# Patient Record
Sex: Female | Born: 1935
Health system: Southern US, Community
[De-identification: ages and names within clinical notes are randomized; demographics above are authoritative.]

## PROBLEM LIST (undated history)

## (undated) DIAGNOSIS — T4145XA Adverse effect of unspecified anesthetic, initial encounter: Secondary | ICD-10-CM

## (undated) DIAGNOSIS — E785 Hyperlipidemia, unspecified: Secondary | ICD-10-CM

## (undated) DIAGNOSIS — M1990 Unspecified osteoarthritis, unspecified site: Secondary | ICD-10-CM

## (undated) DIAGNOSIS — Z9221 Personal history of antineoplastic chemotherapy: Secondary | ICD-10-CM

## (undated) DIAGNOSIS — R112 Nausea with vomiting, unspecified: Secondary | ICD-10-CM

## (undated) DIAGNOSIS — C801 Malignant (primary) neoplasm, unspecified: Secondary | ICD-10-CM

## (undated) DIAGNOSIS — C50919 Malignant neoplasm of unspecified site of unspecified female breast: Secondary | ICD-10-CM

## (undated) DIAGNOSIS — Z923 Personal history of irradiation: Secondary | ICD-10-CM

## (undated) DIAGNOSIS — T8859XA Other complications of anesthesia, initial encounter: Secondary | ICD-10-CM

## (undated) DIAGNOSIS — Z9889 Other specified postprocedural states: Secondary | ICD-10-CM

## (undated) DIAGNOSIS — I10 Essential (primary) hypertension: Secondary | ICD-10-CM

## (undated) HISTORY — DX: Malignant (primary) neoplasm, unspecified: C80.1

## (undated) HISTORY — DX: Hyperlipidemia, unspecified: E78.5

## (undated) HISTORY — DX: Essential (primary) hypertension: I10

## (undated) HISTORY — PX: ABDOMINAL HYSTERECTOMY: SHX81

---

## 1991-01-13 DIAGNOSIS — C50919 Malignant neoplasm of unspecified site of unspecified female breast: Secondary | ICD-10-CM

## 1991-01-13 DIAGNOSIS — Z923 Personal history of irradiation: Secondary | ICD-10-CM

## 1991-01-13 HISTORY — DX: Personal history of irradiation: Z92.3

## 1991-01-13 HISTORY — DX: Malignant neoplasm of unspecified site of unspecified female breast: C50.919

## 1991-01-13 HISTORY — PX: BREAST LUMPECTOMY: SHX2

## 1991-11-13 HISTORY — PX: BREAST SURGERY: SHX581

## 1997-11-27 ENCOUNTER — Ambulatory Visit (HOSPITAL_COMMUNITY): Admission: RE | Admit: 1997-11-27 | Discharge: 1997-11-27 | Payer: Self-pay | Admitting: Surgery

## 1998-11-29 ENCOUNTER — Encounter: Payer: Self-pay | Admitting: Surgery

## 1998-11-29 ENCOUNTER — Ambulatory Visit (HOSPITAL_COMMUNITY): Admission: RE | Admit: 1998-11-29 | Discharge: 1998-11-29 | Payer: Self-pay | Admitting: Surgery

## 1999-12-02 ENCOUNTER — Encounter: Payer: Self-pay | Admitting: Surgery

## 1999-12-02 ENCOUNTER — Ambulatory Visit (HOSPITAL_COMMUNITY): Admission: RE | Admit: 1999-12-02 | Discharge: 1999-12-02 | Payer: Self-pay | Admitting: Surgery

## 2000-12-14 ENCOUNTER — Ambulatory Visit (HOSPITAL_COMMUNITY): Admission: RE | Admit: 2000-12-14 | Discharge: 2000-12-14 | Payer: Self-pay | Admitting: Surgery

## 2000-12-14 ENCOUNTER — Encounter: Payer: Self-pay | Admitting: Surgery

## 2001-12-15 ENCOUNTER — Encounter: Payer: Self-pay | Admitting: Surgery

## 2001-12-15 ENCOUNTER — Ambulatory Visit (HOSPITAL_COMMUNITY): Admission: RE | Admit: 2001-12-15 | Discharge: 2001-12-15 | Payer: Self-pay | Admitting: Surgery

## 2002-12-18 ENCOUNTER — Ambulatory Visit (HOSPITAL_COMMUNITY): Admission: RE | Admit: 2002-12-18 | Discharge: 2002-12-18 | Payer: Self-pay | Admitting: Surgery

## 2003-12-25 ENCOUNTER — Ambulatory Visit (HOSPITAL_COMMUNITY): Admission: RE | Admit: 2003-12-25 | Discharge: 2003-12-25 | Payer: Self-pay | Admitting: Surgery

## 2004-12-30 ENCOUNTER — Ambulatory Visit (HOSPITAL_COMMUNITY): Admission: RE | Admit: 2004-12-30 | Discharge: 2004-12-30 | Payer: Self-pay | Admitting: Surgery

## 2005-12-30 ENCOUNTER — Ambulatory Visit (HOSPITAL_COMMUNITY): Admission: RE | Admit: 2005-12-30 | Discharge: 2005-12-30 | Payer: Self-pay | Admitting: Obstetrics and Gynecology

## 2007-01-03 ENCOUNTER — Ambulatory Visit (HOSPITAL_COMMUNITY): Admission: RE | Admit: 2007-01-03 | Discharge: 2007-01-03 | Payer: Self-pay | Admitting: Surgery

## 2008-01-04 ENCOUNTER — Ambulatory Visit (HOSPITAL_COMMUNITY): Admission: RE | Admit: 2008-01-04 | Discharge: 2008-01-04 | Payer: Self-pay | Admitting: Obstetrics and Gynecology

## 2008-06-15 ENCOUNTER — Ambulatory Visit (HOSPITAL_COMMUNITY): Admission: RE | Admit: 2008-06-15 | Discharge: 2008-06-15 | Payer: Self-pay | Admitting: Unknown Physician Specialty

## 2009-01-08 ENCOUNTER — Ambulatory Visit (HOSPITAL_COMMUNITY): Admission: RE | Admit: 2009-01-08 | Discharge: 2009-01-08 | Payer: Self-pay | Admitting: Surgery

## 2010-01-09 ENCOUNTER — Ambulatory Visit (HOSPITAL_COMMUNITY)
Admission: RE | Admit: 2010-01-09 | Discharge: 2010-01-09 | Payer: Self-pay | Source: Home / Self Care | Attending: Surgery | Admitting: Surgery

## 2010-06-26 ENCOUNTER — Other Ambulatory Visit: Payer: Self-pay | Admitting: Unknown Physician Specialty

## 2010-06-26 DIAGNOSIS — M8580 Other specified disorders of bone density and structure, unspecified site: Secondary | ICD-10-CM

## 2010-06-26 DIAGNOSIS — Z780 Asymptomatic menopausal state: Secondary | ICD-10-CM

## 2010-06-26 DIAGNOSIS — Z1231 Encounter for screening mammogram for malignant neoplasm of breast: Secondary | ICD-10-CM

## 2010-12-03 ENCOUNTER — Other Ambulatory Visit (HOSPITAL_COMMUNITY): Payer: Self-pay | Admitting: Unknown Physician Specialty

## 2010-12-03 DIAGNOSIS — Z1231 Encounter for screening mammogram for malignant neoplasm of breast: Secondary | ICD-10-CM

## 2011-01-15 ENCOUNTER — Ambulatory Visit (HOSPITAL_COMMUNITY)
Admission: RE | Admit: 2011-01-15 | Discharge: 2011-01-15 | Disposition: A | Discharge status: Home / Self Care | Payer: BC Managed Care – PPO | Source: Ambulatory Visit | Attending: Unknown Physician Specialty | Admitting: Unknown Physician Specialty

## 2011-01-15 DIAGNOSIS — Z780 Asymptomatic menopausal state: Secondary | ICD-10-CM

## 2011-01-15 DIAGNOSIS — Z1231 Encounter for screening mammogram for malignant neoplasm of breast: Secondary | ICD-10-CM

## 2011-04-06 ENCOUNTER — Encounter (INDEPENDENT_AMBULATORY_CARE_PROVIDER_SITE_OTHER): Payer: Self-pay

## 2011-04-06 DIAGNOSIS — Z853 Personal history of malignant neoplasm of breast: Secondary | ICD-10-CM | POA: Insufficient documentation

## 2011-04-08 ENCOUNTER — Encounter (INDEPENDENT_AMBULATORY_CARE_PROVIDER_SITE_OTHER): Payer: Self-pay | Admitting: Surgery

## 2011-04-08 ENCOUNTER — Ambulatory Visit (INDEPENDENT_AMBULATORY_CARE_PROVIDER_SITE_OTHER): Payer: Medicare Other | Admitting: Surgery

## 2011-04-08 VITALS — BP 156/98 | HR 72 | Temp 98.1°F | Resp 20 | Ht 61.5 in | Wt 181.2 lb

## 2011-04-08 DIAGNOSIS — Z853 Personal history of malignant neoplasm of breast: Secondary | ICD-10-CM
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
## 2011-09-29 DIAGNOSIS — C549 Malignant neoplasm of corpus uteri, unspecified: Secondary | ICD-10-CM | POA: Insufficient documentation

## 2011-12-15 ENCOUNTER — Other Ambulatory Visit (HOSPITAL_COMMUNITY): Payer: Self-pay | Admitting: Unknown Physician Specialty

## 2011-12-15 DIAGNOSIS — Z853 Personal history of malignant neoplasm of breast: Secondary | ICD-10-CM

## 2011-12-15 DIAGNOSIS — Z1231 Encounter for screening mammogram for malignant neoplasm of breast: Secondary | ICD-10-CM

## 2012-01-18 ENCOUNTER — Ambulatory Visit (HOSPITAL_COMMUNITY)
Admission: RE | Admit: 2012-01-18 | Discharge: 2012-01-18 | Disposition: A | Discharge status: Home / Self Care | Payer: Medicare Other | Source: Ambulatory Visit | Attending: Unknown Physician Specialty | Admitting: Unknown Physician Specialty

## 2012-01-18 DIAGNOSIS — Z853 Personal history of malignant neoplasm of breast: Secondary | ICD-10-CM | POA: Insufficient documentation

## 2012-01-18 DIAGNOSIS — Z1231 Encounter for screening mammogram for malignant neoplasm of breast: Secondary | ICD-10-CM

## 2012-12-19 ENCOUNTER — Other Ambulatory Visit (HOSPITAL_COMMUNITY): Payer: Self-pay | Admitting: Unknown Physician Specialty

## 2012-12-19 DIAGNOSIS — Z1231 Encounter for screening mammogram for malignant neoplasm of breast: Secondary | ICD-10-CM

## 2013-01-20 ENCOUNTER — Ambulatory Visit (HOSPITAL_COMMUNITY)
Admission: RE | Admit: 2013-01-20 | Discharge: 2013-01-20 | Disposition: A | Discharge status: Home / Self Care | Payer: Medicare Other | Source: Ambulatory Visit | Attending: Unknown Physician Specialty | Admitting: Unknown Physician Specialty

## 2013-01-20 DIAGNOSIS — Z1231 Encounter for screening mammogram for malignant neoplasm of breast: Secondary | ICD-10-CM | POA: Insufficient documentation

## 2013-04-25 DIAGNOSIS — E1142 Type 2 diabetes mellitus with diabetic polyneuropathy: Secondary | ICD-10-CM | POA: Insufficient documentation

## 2013-12-19 ENCOUNTER — Other Ambulatory Visit (HOSPITAL_COMMUNITY): Payer: Self-pay | Admitting: Unknown Physician Specialty

## 2013-12-19 DIAGNOSIS — Z1231 Encounter for screening mammogram for malignant neoplasm of breast: Secondary | ICD-10-CM

## 2014-01-22 ENCOUNTER — Ambulatory Visit (HOSPITAL_COMMUNITY)
Admission: RE | Admit: 2014-01-22 | Discharge: 2014-01-22 | Disposition: A | Discharge status: Home / Self Care | Payer: Medicare Other | Source: Ambulatory Visit | Attending: Unknown Physician Specialty | Admitting: Unknown Physician Specialty

## 2014-01-22 DIAGNOSIS — Z1231 Encounter for screening mammogram for malignant neoplasm of breast: Secondary | ICD-10-CM | POA: Insufficient documentation

## 2014-09-14 DIAGNOSIS — H18519 Endothelial corneal dystrophy, unspecified eye: Secondary | ICD-10-CM | POA: Insufficient documentation

## 2014-12-28 ENCOUNTER — Other Ambulatory Visit: Payer: Self-pay | Admitting: Unknown Physician Specialty

## 2014-12-28 DIAGNOSIS — Z1231 Encounter for screening mammogram for malignant neoplasm of breast: Secondary | ICD-10-CM

## 2015-01-25 ENCOUNTER — Ambulatory Visit (INDEPENDENT_AMBULATORY_CARE_PROVIDER_SITE_OTHER): Payer: PPO

## 2015-01-25 DIAGNOSIS — Z1231 Encounter for screening mammogram for malignant neoplasm of breast: Secondary | ICD-10-CM | POA: Diagnosis not present

## 2015-02-15 DIAGNOSIS — H401131 Primary open-angle glaucoma, bilateral, mild stage: Secondary | ICD-10-CM | POA: Insufficient documentation

## 2015-04-18 ENCOUNTER — Other Ambulatory Visit: Payer: Self-pay | Admitting: Unknown Physician Specialty

## 2015-04-18 DIAGNOSIS — M8580 Other specified disorders of bone density and structure, unspecified site: Secondary | ICD-10-CM

## 2015-04-19 ENCOUNTER — Other Ambulatory Visit: Payer: Self-pay | Admitting: Unknown Physician Specialty

## 2015-04-19 DIAGNOSIS — M8580 Other specified disorders of bone density and structure, unspecified site: Secondary | ICD-10-CM

## 2015-05-01 ENCOUNTER — Ambulatory Visit (INDEPENDENT_AMBULATORY_CARE_PROVIDER_SITE_OTHER): Payer: PPO

## 2015-05-01 DIAGNOSIS — M85859 Other specified disorders of bone density and structure, unspecified thigh: Secondary | ICD-10-CM | POA: Diagnosis not present

## 2015-05-01 DIAGNOSIS — M8580 Other specified disorders of bone density and structure, unspecified site: Secondary | ICD-10-CM

## 2015-05-06 ENCOUNTER — Other Ambulatory Visit: Payer: PPO

## 2015-08-11 DIAGNOSIS — H1812 Bullous keratopathy, left eye: Secondary | ICD-10-CM | POA: Insufficient documentation

## 2015-12-19 ENCOUNTER — Other Ambulatory Visit: Payer: Self-pay | Admitting: Unknown Physician Specialty

## 2015-12-19 DIAGNOSIS — Z1231 Encounter for screening mammogram for malignant neoplasm of breast: Secondary | ICD-10-CM

## 2016-01-13 HISTORY — PX: BREAST LUMPECTOMY: SHX2

## 2016-01-28 ENCOUNTER — Ambulatory Visit (INDEPENDENT_AMBULATORY_CARE_PROVIDER_SITE_OTHER): Payer: Medicare HMO

## 2016-01-28 DIAGNOSIS — R69 Illness, unspecified: Secondary | ICD-10-CM | POA: Diagnosis not present

## 2016-01-28 DIAGNOSIS — Z1231 Encounter for screening mammogram for malignant neoplasm of breast: Secondary | ICD-10-CM

## 2016-01-28 DIAGNOSIS — R928 Other abnormal and inconclusive findings on diagnostic imaging of breast: Secondary | ICD-10-CM | POA: Diagnosis not present

## 2016-01-31 ENCOUNTER — Other Ambulatory Visit: Payer: Self-pay | Admitting: Unknown Physician Specialty

## 2016-01-31 DIAGNOSIS — R928 Other abnormal and inconclusive findings on diagnostic imaging of breast: Secondary | ICD-10-CM

## 2016-02-07 ENCOUNTER — Ambulatory Visit
Admission: RE | Admit: 2016-02-07 | Discharge: 2016-02-07 | Disposition: A | Discharge status: Home / Self Care | Payer: Medicare HMO | Source: Ambulatory Visit | Attending: Unknown Physician Specialty | Admitting: Unknown Physician Specialty

## 2016-02-07 ENCOUNTER — Other Ambulatory Visit: Payer: Self-pay | Admitting: Unknown Physician Specialty

## 2016-02-07 DIAGNOSIS — N6310 Unspecified lump in the right breast, unspecified quadrant: Secondary | ICD-10-CM

## 2016-02-07 DIAGNOSIS — R928 Other abnormal and inconclusive findings on diagnostic imaging of breast: Secondary | ICD-10-CM

## 2016-02-07 DIAGNOSIS — N6312 Unspecified lump in the right breast, upper inner quadrant: Secondary | ICD-10-CM | POA: Diagnosis not present

## 2016-02-13 ENCOUNTER — Other Ambulatory Visit: Payer: Self-pay | Admitting: Unknown Physician Specialty

## 2016-02-13 DIAGNOSIS — N6310 Unspecified lump in the right breast, unspecified quadrant: Secondary | ICD-10-CM

## 2016-02-14 ENCOUNTER — Ambulatory Visit
Admission: RE | Admit: 2016-02-14 | Discharge: 2016-02-14 | Disposition: A | Discharge status: Home / Self Care | Payer: Medicare HMO | Source: Ambulatory Visit | Attending: Unknown Physician Specialty | Admitting: Unknown Physician Specialty

## 2016-02-14 DIAGNOSIS — D0511 Intraductal carcinoma in situ of right breast: Secondary | ICD-10-CM | POA: Diagnosis not present

## 2016-02-14 DIAGNOSIS — N6314 Unspecified lump in the right breast, lower inner quadrant: Secondary | ICD-10-CM | POA: Diagnosis not present

## 2016-02-14 DIAGNOSIS — N6312 Unspecified lump in the right breast, upper inner quadrant: Secondary | ICD-10-CM | POA: Diagnosis not present

## 2016-02-14 DIAGNOSIS — N6310 Unspecified lump in the right breast, unspecified quadrant: Secondary | ICD-10-CM

## 2016-02-18 ENCOUNTER — Other Ambulatory Visit: Payer: Self-pay | Admitting: Surgery

## 2016-02-18 DIAGNOSIS — D0591 Unspecified type of carcinoma in situ of right breast: Secondary | ICD-10-CM | POA: Diagnosis not present

## 2016-02-18 DIAGNOSIS — Z853 Personal history of malignant neoplasm of breast: Secondary | ICD-10-CM | POA: Diagnosis not present

## 2016-02-27 ENCOUNTER — Other Ambulatory Visit: Payer: Self-pay | Admitting: Surgery

## 2016-02-27 DIAGNOSIS — D0591 Unspecified type of carcinoma in situ of right breast: Secondary | ICD-10-CM

## 2016-03-09 ENCOUNTER — Encounter (HOSPITAL_COMMUNITY): Payer: Self-pay

## 2016-03-09 ENCOUNTER — Other Ambulatory Visit: Payer: Self-pay

## 2016-03-09 ENCOUNTER — Encounter (HOSPITAL_COMMUNITY)
Admission: RE | Admit: 2016-03-09 | Discharge: 2016-03-09 | Disposition: A | Discharge status: Home / Self Care | Payer: Medicare HMO | Source: Ambulatory Visit | Attending: Surgery | Admitting: Surgery

## 2016-03-09 DIAGNOSIS — Z8542 Personal history of malignant neoplasm of other parts of uterus: Secondary | ICD-10-CM | POA: Insufficient documentation

## 2016-03-09 DIAGNOSIS — E785 Hyperlipidemia, unspecified: Secondary | ICD-10-CM | POA: Diagnosis not present

## 2016-03-09 DIAGNOSIS — I10 Essential (primary) hypertension: Secondary | ICD-10-CM | POA: Insufficient documentation

## 2016-03-09 DIAGNOSIS — Z853 Personal history of malignant neoplasm of breast: Secondary | ICD-10-CM | POA: Insufficient documentation

## 2016-03-09 DIAGNOSIS — E119 Type 2 diabetes mellitus without complications: Secondary | ICD-10-CM | POA: Insufficient documentation

## 2016-03-09 DIAGNOSIS — Z01812 Encounter for preprocedural laboratory examination: Secondary | ICD-10-CM | POA: Insufficient documentation

## 2016-03-09 HISTORY — DX: Other specified postprocedural states: Z98.890

## 2016-03-09 HISTORY — DX: Other specified postprocedural states: R11.2

## 2016-03-09 HISTORY — DX: Adverse effect of unspecified anesthetic, initial encounter: T41.45XA

## 2016-03-09 HISTORY — DX: Other complications of anesthesia, initial encounter: T88.59XA

## 2016-03-09 HISTORY — DX: Unspecified osteoarthritis, unspecified site: M19.90

## 2016-03-09 LAB — GLUCOSE, CAPILLARY: Glucose-Capillary: 199 mg/dL — ABNORMAL HIGH (ref 65–99)

## 2016-03-09 LAB — BASIC METABOLIC PANEL
Anion gap: 10 (ref 5–15)
BUN: 7 mg/dL (ref 6–20)
CO2: 26 mmol/L (ref 22–32)
Calcium: 9.7 mg/dL (ref 8.9–10.3)
Chloride: 101 mmol/L (ref 101–111)
Creatinine, Ser: 0.62 mg/dL (ref 0.44–1.00)
GFR calc Af Amer: >60 mL/min (ref >60)
GFR calc non Af Amer: >60 mL/min (ref >60)
Glucose, Bld: 199 mg/dL — ABNORMAL HIGH (ref 65–99)
Potassium: 3.4 mmol/L — ABNORMAL LOW (ref 3.5–5.1)
Sodium: 137 mmol/L (ref 135–145)

## 2016-03-09 LAB — CBC
HCT: 41.8 % (ref 36.0–46.0)
Hemoglobin: 13.7 g/dL (ref 12.0–15.0)
MCH: 28.7 pg (ref 26.0–34.0)
MCHC: 32.8 g/dL (ref 30.0–36.0)
MCV: 87.6 fL (ref 78.0–100.0)
Platelets: 339 10*3/uL (ref 150–400)
RBC: 4.77 MIL/uL (ref 3.87–5.11)
RDW: 14.4 % (ref 11.5–15.5)
WBC: 10.2 10*3/uL (ref 4.0–10.5)

## 2016-03-09 MED ORDER — CHLORHEXIDINE GLUCONATE CLOTH 2 % EX PADS: 6.0000 | MEDICATED_PAD | Freq: Once | CUTANEOUS | Status: DC

## 2016-03-09 NOTE — Progress Notes (Signed)
PCP: Finis Bud, MD  Cardiologist: pt denies  EKG: pt denies  ECHO: denies  Cardiac Cath: pt denies  Stress test: pt denies  Chest X-ray: pt denies past year

## 2016-03-09 NOTE — Pre-Procedure Instructions (Signed)
Navi Polis Jaworski  03/09/2016      [facility] - [place], [place] - [address] [place] [address] Phone: [phone] Fax: [place] [address] [place] [address] Phone: [phone] Fax: [phone]    Your procedure is scheduled on Fri, Mar 2 @ 7:30 AM  Report to [facility] at 5:30 AM  Call this number if you have problems the morning of surgery:  [phone]   Remember:  Do not eat food or drink liquids after midnight.  Take these medicines the morning of surgery with A SIP OF WATER Eye Drops             Stop taking your Vitamins along with any Herbal Medications. No Goody's,BC's,Aleve,Advil,Motrin,Ibuprofen,or Fish Oil.      How to Manage Your Diabetes Before and After Surgery  Why is it important to control my blood sugar before and after surgery? . Improving blood sugar levels before and after surgery helps healing and can limit problems. . A way of improving blood sugar control is eating a healthy diet by: o  Eating less sugar and carbohydrates o  Increasing activity/exercise o  Talking with your doctor about reaching your blood sugar goals . High blood sugars (greater than 180 mg/dL) can raise your risk of infections and slow your recovery, so you will need to focus on controlling your diabetes during the weeks before surgery. . Make sure that the doctor who takes care of your diabetes knows about your planned surgery including the date and location.  How do I manage my blood sugar before surgery? . Check your blood sugar at least 4 times a day, starting 2 days before surgery, to make sure that the level is not too high or low. o Check your blood sugar the morning of your surgery when you wake up and every 2 hours until you get to the Short Stay unit. . If your blood sugar is less than 70 mg/dL, you will need to treat for low blood  sugar: o Do not take insulin. o Treat a low blood sugar (less than 70 mg/dL) with  cup of clear juice (cranberry or apple), 4 glucose tablets, OR glucose gel. o Recheck blood sugar in 15 minutes after treatment (to make sure it is greater than 70 mg/dL). If your blood sugar is not greater than 70 mg/dL on recheck, call [phone] for further instructions. . Report your blood sugar to the short stay nurse when you get to Short Stay.  . If you are admitted to the hospital after surgery: o Your blood sugar will be checked by the staff and you will probably be given insulin after surgery (instead of oral diabetes medicines) to make sure you have good blood sugar levels. o The goal for blood sugar control after surgery is 80-180 mg/dL.              WHAT DO I DO ABOUT MY DIABETES MEDICATION?   Marland Kitchen Do not take oral diabetes medicines (pills) the morning of surgery.        Reviewed and Endorsed by [facility] Patient Education Committee, August 2015   Do not wear jewelry, make-up or nail polish.  Do not wear lotions, powders, perfumes, or deoderant.  Do not shave 48 hours prior to surgery.    Do not bring valuables to the hospital.  [facility] is  not responsible for any belongings or valuables.  Contacts, dentures or bridgework may not be worn into surgery.  Leave your suitcase in the car.  After surgery it may be brought to your room.  For patients admitted to the hospital, discharge time will be determined by your treatment team.  Patients discharged the day of surgery will not be allowed to drive home.   Special instructions:   [facility] - Preparing for Surgery  Before surgery, you can play an important role.  Because skin is not sterile, your skin needs to be as free of germs as possible.  You can reduce the number of germs on you skin by washing with CHG (chlorahexidine gluconate) soap before surgery.  CHG is an antiseptic cleaner which kills germs and bonds with the  skin to continue killing germs even after washing.  Please DO NOT use if you have an allergy to CHG or antibacterial soaps.  If your skin becomes reddened/irritated stop using the CHG and inform your nurse when you arrive at Short Stay.  Do not shave (including legs and underarms) for at least 48 hours prior to the first CHG shower.  You may shave your face.  Please follow these instructions carefully:   1.  Shower with CHG Soap the night before surgery and the                                morning of Surgery.  2.  If you choose to wash your hair, wash your hair first as usual with your       normal shampoo.  3.  After you shampoo, rinse your hair and body thoroughly to remove the                      Shampoo.  4.  Use CHG as you would any other liquid soap.  You can apply chg directly       to the skin and wash gently with scrungie or a clean washcloth.  5.  Apply the CHG Soap to your body ONLY FROM THE NECK DOWN.        Do not use on open wounds or open sores.  Avoid contact with your eyes,       ears, mouth and genitals (private parts).  Wash genitals (private parts)       with your normal soap.  6.  Wash thoroughly, paying special attention to the area where your surgery        will be performed.  7.  Thoroughly rinse your body with warm water from the neck down.  8.  DO NOT shower/wash with your normal soap after using and rinsing off       the CHG Soap.  9.  Pat yourself dry with a clean towel.            10.  Wear clean pajamas.            11.  Place clean sheets on your bed the night of your first shower and do not        sleep with pets.  Day of Surgery  Do not apply any lotions/deoderants the morning of surgery.  Please wear clean clothes to the hospital/surgery center.  Please read over the following fact sheets that you were given. Pain Booklet, Coughing and Deep Breathing and Surgical Site Infection Prevention

## 2016-03-10 LAB — HEMOGLOBIN A1C
Hgb A1c MFr Bld: 6.8 % — ABNORMAL HIGH (ref 4.8–5.6)
Mean Plasma Glucose: 148 mg/dL

## 2016-03-11 ENCOUNTER — Ambulatory Visit
Admission: RE | Admit: 2016-03-11 | Discharge: 2016-03-11 | Disposition: A | Discharge status: Home / Self Care | Payer: Medicare HMO | Source: Ambulatory Visit | Attending: Surgery | Admitting: Surgery

## 2016-03-11 ENCOUNTER — Inpatient Hospital Stay: Admission: RE | Admit: 2016-03-11 | Payer: Medicare HMO | Source: Ambulatory Visit

## 2016-03-11 ENCOUNTER — Other Ambulatory Visit: Payer: Self-pay | Admitting: Surgery

## 2016-03-11 DIAGNOSIS — D0591 Unspecified type of carcinoma in situ of right breast: Secondary | ICD-10-CM

## 2016-03-11 DIAGNOSIS — C50911 Malignant neoplasm of unspecified site of right female breast: Secondary | ICD-10-CM | POA: Diagnosis not present

## 2016-03-12 ENCOUNTER — Encounter (HOSPITAL_COMMUNITY): Payer: Self-pay | Admitting: Anesthesiology

## 2016-03-12 NOTE — H&P (Signed)
[place]  Location: [facility] Patient #: [id number] DOB: Nov 24, 1934 Divorced / Language: English / Race: Black or African American Female  History of Present Illness   The patient is a 82 year old female who presents with a complaint of breast cancer.  The PCP is Dr. Quincy Sheehan  The patient was referred by Dr. Franki Cabot.   Letta Pate, youngest daughter, is with her.  I last saw Annelisse in 04/08/2011 for follow-up of a stage I right breast cancer she had in 04/05/1991. She had a 1.2 cm grade 1, invasive ductal carcinoma with 0/9 nodes treated with right breast lumpectomy and radiation therapy. Dr. Nila Nephew saw her for radiation therapy. I placed her on tamoxifen for 5 years after the surgery.  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 This was treated in [place] with a TAHBSO, rad tx, and chemo tx. She is disease free as far as she knows.  She had a screening mammogram on 01/28/2016 which showed an asyemtry in the right breast at the 3 o'clock position. Her breast density is "b". The is a 0.6 x 0.4 cm mass at the 3 o'clock position. She had a right breast biopsy at 3 o'clock on 02/14/2016. Her path [id number]) showed right DCIS, low grade. ER - 100% and PR - 95%.  I discussed the options for breast cancer treatment with the patient. I discussed a multidisciplinary approach to the treatment of breast cancer, which includes medical oncology and radiation oncology. I discussed the surgical options of lumpectomy vs. mastectomy. If mastectomy, there is the possibility of reconstruction. I discussed the options of lymph node biopsy. The treatment plan depends on the pathologic staging of the tumor and the patient's personal wishes. The risks of surgery include, but are not limited to, bleeding, infection, the need for further surgery, and nerve injury. The patient has been given literature on the treatment of  breast cancer.  Plan: 1) Inspite of being the second cancer in her right breast, I think that this tumor is so small and the pathology is so favorable, we could just do a lumpectomy, 2) Consider antiestrogen, depending on final pathology  Past Medical History: 1. Right breast cancer, Stage 1. Lumpectomy 1993. 0/9 nodes positive. For the last surgery - she had N&V and had to spend the night.  2. left eye corneal tranplant x 3 Doing well now 3. Diabetes Sees dr. Susette Racer, Endocrinology in [place] On insulin HgbA1C - 10/07/2015 - 6.8 4. She had uterine cancer in Sept 2013 From notes that I can see, she had a mixed Mullerian uterine sarcoma She had a TAHBSO - then had rad tx x 4, and chemo tx x 4 She talked about losing her hair. She is seen by Marlane Mingle, PA in [place] - the last encounter I can find was 01/02/2016 5. Obese BMI - 34.6  Social History: Divorced (divorced husband is dead) She retired in 2001-04-04. Letta Pate, youngest daughter, is with her. She has 7 children total - 5 sons and 2 daughters.    Past Surgical History Malachy Moan, [place]; 02/18/2016 4:36 PM) Breast Biopsy  Right. Breast Mass; Local Excision  Right. Cataract Surgery  Left. Hysterectomy (due to cancer) - Complete   Diagnostic Studies History Malachy Moan, [place]; 02/18/2016 4:36 PM) Colonoscopy  1-5 years ago Mammogram  1-3 years ago  Allergies Malachy Moan, RMA; 02/18/2016 4:37 PM) No Known Allergies 02/18/2016  Medication History Holley Dexter  Quentin Ore, RMA; 02/18/2016 4:38 PM) Brimonidine Tartrate (0.2% Solution, Ophthalmic) Active. GlipiZIDE XL (10MG Tablet ER 24HR, Oral) Active. Losartan Potassium-HCTZ (100-12.5MG Tablet, Oral) Active. OneTouch Ultra Blue (In Vitro) Active. Timolol Maleate (0.5% Solution, Ophthalmic) Active. Simvastatin (20MG Tablet, Oral)  Active. GlipiZIDE (10MG Tablet, Oral) Active. Betagan (0.5% Solution, Ophthalmic) Active. Hyzaar (100-12.5MG Tablet, Oral) Active. Multiple Vitamins (Oral) Active. Medications Reconciled  Social History Malachy Moan, [place]; 02/18/2016 4:36 PM) Caffeine use  Coffee, Tea. No alcohol use  No drug use  Tobacco use  Never smoker.  Family History Malachy Moan, [place]; 02/18/2016 4:36 PM) Alcohol Abuse  Brother. Cerebrovascular Accident  Brother, Mother. Diabetes Mellitus  Daughter, Sister. Heart Disease  Brother. Kidney Disease  Mother. Respiratory Condition  Father.  Pregnancy / Birth History Malachy Moan, [place]; 02/18/2016 4:36 PM) Age at menarche  74 years. Age of menopause  <45 Gravida  8 Irregular periods  Maternal age  65-20 Para  50  Other Problems Malachy Moan, [place]; 02/18/2016 4:36 PM) Bladder Problems  Breast Cancer  Diabetes Mellitus  General anesthesia - complications  High blood pressure  Hypercholesterolemia  Lump In Breast     Review of Systems Malachy Moan RMA; 02/18/2016 4:36 PM) General Not Present- Appetite Loss, Chills, Fatigue, Fever, Night Sweats, Weight Gain and Weight Loss. Skin Not Present- Change in Wart/Mole, Dryness, Hives, Jaundice, New Lesions, Non-Healing Wounds, Rash and Ulcer. HEENT Present- Wears glasses/contact lenses. Not Present- Earache, Hearing Loss, Hoarseness, Nose Bleed, Oral Ulcers, Ringing in the Ears, Seasonal Allergies, Sinus Pain, Sore Throat, Visual Disturbances and Yellow Eyes. Respiratory Present- Snoring. Not Present- Bloody sputum, Chronic Cough, Difficulty Breathing and Wheezing. Breast Present- Breast Mass. Not Present- Breast Pain, Nipple Discharge and Skin Changes. Cardiovascular Not Present- Chest Pain, Difficulty Breathing Lying Down, Leg Cramps, Palpitations, Rapid Heart Rate, Shortness of Breath and Swelling of Extremities. Gastrointestinal Not Present- Abdominal Pain, Bloating,  Bloody Stool, Change in Bowel Habits, Chronic diarrhea, Constipation, Difficulty Swallowing, Excessive gas, Gets full quickly at meals, Hemorrhoids, Indigestion, Nausea, Rectal Pain and Vomiting.  Vitals Malachy Moan RMA; 02/18/2016 4:38 PM) 02/18/2016 4:38 PM Weight: 189.6 lb Height: 62in Body Surface Area: 1.87 m Body Mass Index: 34.68 kg/m  Temp.: 98.48F  Pulse: 77 (Regular)  BP: 140/90 (Sitting, Left Arm, Standard)   Physical Exam  General: older obese AA F alert and generally healthy appearing. HEENT: Normal. Pupils equal. She pointed out one area of dark hair on her left side that was left after chemotx.  Neck: Supple. No mass. No thyroid mass. Lymph Nodes: No supraclavicular or cervical nodes.  Lungs: Clear to auscultation and symmetric breath sounds. Heart: RRR. No murmur or rub.  Breasts: Right - No obvious mass. Her right breast smaller than the left. The scars looks okay. I don't feel a mass. Left - no mass or nodule.  Abdomen: Soft. No mass. No tenderness. No hernia. Normal bowel sounds. Obese.  Extremities: Good strength and ROM in upper and lower extremities.  Neurologic: Grossly intact to motor and sensory function. Psychiatric: Has normal mood and affect. Behavior is normal.   Assessment & Plan  1.  BREAST CANCER, STAGE 0, RIGHT (D05.91)  Story: Right breast biopsy on 02/14/2016. Her path [id number]) showed right DCIS. ER/PR positive.  Plan   1) right breast lumpectomy (seed localization)   2) possible antiestrogen therapy post op  2.  HISTORY OF RIGHT BREAST CANCER (Z85.3)  Story: In 1993. She had a 1.2 cm grade 1, invasive ductal carcinoma with 0/9 nodes treated with right  breast lumpectomy and radiation therapy.  Rad tx by Dr. Sherren Kerns.  3. left eye corneal tranplant x 3  Doing well now 4. Diabetes  Sees dr. Susette Racer, Endocrinology in [place]  On insulin  HgbA1C - 10/07/2015 - 6.8 5. She had uterine  cancer in Sept 2013   From notes that I can see, she had a mixed Mullerian uterine sarcoma  She had a TAHBSO - then had rad tx x 4, and chemo tx x 4  She talked about losing her hair.  She is seen by Marlane Mingle, PA in [place] - the last encounter I can find was 01/02/2016 6. Obese BMI - 34.6   Alphonsa Overall, MD, [facility] Pager: [phone] Office phone:  [phone]

## 2016-03-12 NOTE — Anesthesia Preprocedure Evaluation (Addendum)
Anesthesia Evaluation  Patient identified by MRN, date of birth, ID band Patient awake    Reviewed: Allergy & Precautions, NPO status , Patient's Chart, lab work & pertinent test results  History of Anesthesia Complications (+) PONV and history of anesthetic complications  Airway Mallampati: I  TM Distance: >3 FB Neck ROM: full    Dental  (+) Teeth Intact, Dental Advidsory Given   Pulmonary neg pulmonary ROS,    breath sounds clear to auscultation       Cardiovascular hypertension, Pt. on medications  Rhythm:regular Rate:Normal     Neuro/Psych negative neurological ROS  negative psych ROS   GI/Hepatic negative GI ROS, Neg liver ROS,   Endo/Other  diabetes, Type 2, Insulin Dependent, Oral Hypoglycemic Agents  Renal/GU negative Renal ROS  negative genitourinary   Musculoskeletal  (+) Arthritis , Osteoarthritis,    Abdominal   Peds negative pediatric ROS (+)  Hematology negative hematology ROS (+)   Anesthesia Other Findings Obese   Reproductive/Obstetrics negative OB ROS                           Anesthesia Physical Anesthesia Plan  ASA: II  Anesthesia Plan: General   Post-op Pain Management:    Induction: Intravenous  Airway Management Planned: LMA  Additional Equipment:   Intra-op Plan:   Post-operative Plan: Extubation in OR  Informed Consent: I have reviewed the patients History and Physical, chart, labs and discussed the procedure including the risks, benefits and alternatives for the proposed anesthesia with the patient or authorized representative who has indicated his/her understanding and acceptance.   Dental advisory given and Dental Advisory Given  Plan Discussed with: CRNA, Surgeon and Anesthesiologist  Anesthesia Plan Comments:        Anesthesia Quick Evaluation

## 2016-03-13 ENCOUNTER — Ambulatory Visit (HOSPITAL_COMMUNITY): Payer: Medicare HMO | Admitting: Anesthesiology

## 2016-03-13 ENCOUNTER — Ambulatory Visit
Admission: RE | Admit: 2016-03-13 | Discharge: 2016-03-13 | Disposition: A | Discharge status: Home / Self Care | Payer: Medicare HMO | Source: Ambulatory Visit | Attending: Surgery | Admitting: Surgery

## 2016-03-13 ENCOUNTER — Encounter (HOSPITAL_COMMUNITY): Payer: Self-pay | Admitting: *Deleted

## 2016-03-13 ENCOUNTER — Ambulatory Visit (HOSPITAL_COMMUNITY)
Admission: RE | Admit: 2016-03-13 | Discharge: 2016-03-13 | Disposition: A | Discharge status: Home / Self Care | Payer: Medicare HMO | Source: Ambulatory Visit | Attending: Surgery | Admitting: Surgery

## 2016-03-13 ENCOUNTER — Encounter (HOSPITAL_COMMUNITY)
Admission: RE | Disposition: A | Discharge status: Home / Self Care | Payer: Self-pay | Source: Ambulatory Visit | Attending: Surgery

## 2016-03-13 DIAGNOSIS — Z853 Personal history of malignant neoplasm of breast: Secondary | ICD-10-CM | POA: Diagnosis not present

## 2016-03-13 DIAGNOSIS — R928 Other abnormal and inconclusive findings on diagnostic imaging of breast: Secondary | ICD-10-CM | POA: Diagnosis not present

## 2016-03-13 DIAGNOSIS — Z794 Long term (current) use of insulin: Secondary | ICD-10-CM | POA: Diagnosis not present

## 2016-03-13 DIAGNOSIS — Z6836 Body mass index (BMI) 36.0-36.9, adult: Secondary | ICD-10-CM | POA: Diagnosis not present

## 2016-03-13 DIAGNOSIS — E119 Type 2 diabetes mellitus without complications: Secondary | ICD-10-CM | POA: Insufficient documentation

## 2016-03-13 DIAGNOSIS — Z79899 Other long term (current) drug therapy: Secondary | ICD-10-CM | POA: Insufficient documentation

## 2016-03-13 DIAGNOSIS — I10 Essential (primary) hypertension: Secondary | ICD-10-CM | POA: Insufficient documentation

## 2016-03-13 DIAGNOSIS — E669 Obesity, unspecified: Secondary | ICD-10-CM | POA: Diagnosis not present

## 2016-03-13 DIAGNOSIS — C50911 Malignant neoplasm of unspecified site of right female breast: Secondary | ICD-10-CM | POA: Diagnosis not present

## 2016-03-13 DIAGNOSIS — Z8542 Personal history of malignant neoplasm of other parts of uterus: Secondary | ICD-10-CM | POA: Insufficient documentation

## 2016-03-13 DIAGNOSIS — Z923 Personal history of irradiation: Secondary | ICD-10-CM | POA: Insufficient documentation

## 2016-03-13 DIAGNOSIS — D0591 Unspecified type of carcinoma in situ of right breast: Secondary | ICD-10-CM

## 2016-03-13 DIAGNOSIS — E7800 Pure hypercholesterolemia, unspecified: Secondary | ICD-10-CM | POA: Insufficient documentation

## 2016-03-13 DIAGNOSIS — Z7982 Long term (current) use of aspirin: Secondary | ICD-10-CM | POA: Diagnosis not present

## 2016-03-13 DIAGNOSIS — D0511 Intraductal carcinoma in situ of right breast: Secondary | ICD-10-CM | POA: Diagnosis not present

## 2016-03-13 DIAGNOSIS — N6011 Diffuse cystic mastopathy of right breast: Secondary | ICD-10-CM | POA: Diagnosis not present

## 2016-03-13 HISTORY — PX: BREAST LUMPECTOMY WITH RADIOACTIVE SEED LOCALIZATION: SHX6424

## 2016-03-13 LAB — GLUCOSE, CAPILLARY
Glucose-Capillary: 160 mg/dL — ABNORMAL HIGH (ref 65–99)
Glucose-Capillary: 158 mg/dL — ABNORMAL HIGH (ref 65–99)

## 2016-03-13 SURGERY — BREAST LUMPECTOMY WITH RADIOACTIVE SEED LOCALIZATION
Anesthesia: General | Site: Breast | Laterality: Right

## 2016-03-13 MED ORDER — DEXAMETHASONE SODIUM PHOSPHATE 10 MG/ML IJ SOLN
INTRAMUSCULAR | Status: DC | PRN
  Administered 2016-03-13: 10 mg via INTRAVENOUS

## 2016-03-13 MED ORDER — GABAPENTIN 300 MG PO CAPS
ORAL_CAPSULE | ORAL | Status: AC
  Administered 2016-03-13: 300 mg via ORAL
  Filled 2016-03-13: qty 1

## 2016-03-13 MED ORDER — SCOPOLAMINE 1 MG/3DAYS TD PT72SCOPOLAMINE 1 MG/3DAYS
MEDICATED_PATCH | TRANSDERMAL | Status: DC | PRN
Start: 2016-03-13 — End: 2016-03-13
  Administered 2016-03-13: 1 via TRANSDERMAL

## 2016-03-13 MED ORDER — FENTANYL CITRATE (PF) 100 MCG/2ML IJ SOLN
INTRAMUSCULAR | Status: AC
  Filled 2016-03-13: qty 4

## 2016-03-13 MED ORDER — 0.9 % SODIUM CHLORIDE (POUR BTL) OPTIME
TOPICAL | Status: DC | PRN
Start: 2016-03-13 — End: 2016-03-13
  Administered 2016-03-13: 1000 mL

## 2016-03-13 MED ORDER — FENTANYL CITRATE (PF) 100 MCG/2ML IJ SOLN
INTRAMUSCULAR | Status: DC | PRN
  Administered 2016-03-13 (×2): 25 ug via INTRAVENOUS
  Administered 2016-03-13: 50 ug via INTRAVENOUS

## 2016-03-13 MED ORDER — LACTATED RINGERS IV SOLN
INTRAVENOUS | Status: DC | PRN
  Administered 2016-03-13 (×2): via INTRAVENOUS

## 2016-03-13 MED ORDER — PROPOFOL 10 MG/ML IV BOLUS
INTRAVENOUS | Status: AC
  Filled 2016-03-13: qty 20

## 2016-03-13 MED ORDER — ONDANSETRON HCL 4 MG/2ML IJ SOLN
INTRAMUSCULAR | Status: DC | PRN
  Administered 2016-03-13: 4 mg via INTRAVENOUS

## 2016-03-13 MED ORDER — BUPIVACAINE-EPINEPHRINE (PF) 0.5% -1:200000 IJ SOLN
INTRAMUSCULAR | Status: DC | PRN
  Administered 2016-03-13: 30 mL

## 2016-03-13 MED ORDER — SCOPOLAMINE 1 MG/3DAYS TD PT72
MEDICATED_PATCH | TRANSDERMAL | Status: AC
  Filled 2016-03-13: qty 1

## 2016-03-13 MED ORDER — PROPOFOL 10 MG/ML IV BOLUS
INTRAVENOUS | Status: DC | PRN
  Administered 2016-03-13: 150 mg via INTRAVENOUS

## 2016-03-13 MED ORDER — PHENYLEPHRINE HCL 10 MG/ML IJ SOLN
INTRAMUSCULAR | Status: DC | PRN
  Administered 2016-03-13: 80 ug via INTRAVENOUS

## 2016-03-13 MED ORDER — GABAPENTIN 300 MG PO CAPS
300.0000 mg | ORAL_CAPSULE | ORAL | Status: AC
  Administered 2016-03-13: 300 mg via ORAL

## 2016-03-13 MED ORDER — PROMETHAZINE HCL 25 MG/ML IJ SOLN
6.2500 mg | INTRAMUSCULAR | Status: DC | PRN
Start: 2016-03-13 — End: 2016-03-13

## 2016-03-13 MED ORDER — FENTANYL CITRATE (PF) 100 MCG/2ML IJ SOLN: 25.0000 ug | INTRAMUSCULAR | Status: DC | PRN

## 2016-03-13 MED ORDER — BUPIVACAINE HCL (PF) 0.25 % IJ SOLN
INTRAMUSCULAR | Status: AC
  Filled 2016-03-13: qty 30

## 2016-03-13 MED ORDER — LIDOCAINE HCL (CARDIAC) 20 MG/ML IV SOLN
INTRAVENOUS | Status: DC | PRN
  Administered 2016-03-13: 60 mg via INTRAVENOUS

## 2016-03-13 MED ORDER — BUPIVACAINE-EPINEPHRINE (PF) 0.5% -1:200000 IJ SOLN
INTRAMUSCULAR | Status: AC
  Filled 2016-03-13: qty 30

## 2016-03-13 MED ORDER — ACETAMINOPHEN 500 MG PO TABS
ORAL_TABLET | ORAL | Status: AC
  Administered 2016-03-13: 1000 mg via ORAL
  Filled 2016-03-13: qty 2

## 2016-03-13 MED ORDER — ACETAMINOPHEN 500 MG PO TABS
1000.0000 mg | ORAL_TABLET | ORAL | Status: AC
  Administered 2016-03-13: 1000 mg via ORAL

## 2016-03-13 MED ORDER — CEFAZOLIN SODIUM-DEXTROSE 2-4 GM/100ML-% IV SOLN
2.0000 g | INTRAVENOUS | Status: AC
  Administered 2016-03-13: 2 g via INTRAVENOUS
  Filled 2016-03-13: qty 100

## 2016-03-13 MED ORDER — MEPERIDINE HCL 25 MG/ML IJ SOLN: 6.2500 mg | INTRAMUSCULAR | Status: DC | PRN

## 2016-03-13 MED ORDER — LACTATED RINGERS IV SOLN: INTRAVENOUS | Status: DC

## 2016-03-13 MED ORDER — TRAMADOL HCL 50 MG PO TABS: 50.0000 mg | ORAL_TABLET | Freq: Four times a day (QID) | ORAL | 0 refills | Status: DC | PRN

## 2016-03-13 SURGICAL SUPPLY — 46 items
ADH SKN CLS APL DERMABOND .7 (GAUZE/BANDAGES/DRESSINGS) ×1
APPLIER CLIP 9.375 MED OPEN (MISCELLANEOUS)
APR CLP MED 9.3 20 MLT OPN (MISCELLANEOUS)
BINDER BREAST LRG (GAUZE/BANDAGES/DRESSINGS) IMPLANT
BINDER BREAST XLRG (GAUZE/BANDAGES/DRESSINGS) ×1 IMPLANT
BLADE SURG 15 STRL LF DISP TIS (BLADE) ×1 IMPLANT
BLADE SURG 15 STRL SS (BLADE) ×2
CANISTER SUCT 3000ML PPV (MISCELLANEOUS) ×2 IMPLANT
CHLORAPREP W/TINT 26ML (MISCELLANEOUS) ×2 IMPLANT
CLIP APPLIE 9.375 MED OPEN (MISCELLANEOUS) IMPLANT
CLIP TI WIDE RED SMALL 6 (CLIP) ×2 IMPLANT
COVER PROBE W GEL 5X96 (DRAPES) ×2 IMPLANT
COVER SURGICAL LIGHT HANDLE (MISCELLANEOUS) ×2 IMPLANT
DERMABOND ADVANCED (GAUZE/BANDAGES/DRESSINGS) ×1
DERMABOND ADVANCED .7 DNX12 (GAUZE/BANDAGES/DRESSINGS) ×1 IMPLANT
DEVICE DUBIN SPECIMEN MAMMOGRA (MISCELLANEOUS) ×2 IMPLANT
DRAPE CHEST BREAST 15X10 FENES (DRAPES) ×2 IMPLANT
DRAPE UTILITY XL STRL (DRAPES) ×2 IMPLANT
ELECT COATED BLADE 2.86 ST (ELECTRODE) ×2 IMPLANT
ELECT REM PT RETURN 9FT ADLT (ELECTROSURGICAL) ×2
ELECTRODE REM PT RTRN 9FT ADLT (ELECTROSURGICAL) ×1 IMPLANT
GAUZE SPONGE 4X4 12PLY STRL (GAUZE/BANDAGES/DRESSINGS) ×2 IMPLANT
GLOVE BIOGEL PI IND STRL 7.0 (GLOVE) IMPLANT
GLOVE BIOGEL PI INDICATOR 7.0 (GLOVE) ×1
GLOVE SURG SIGNA 7.5 PF LTX (GLOVE) ×2 IMPLANT
GLOVE SURG SS PI 7.0 STRL IVOR (GLOVE) ×1 IMPLANT
GOWN STRL REUS W/ TWL LRG LVL3 (GOWN DISPOSABLE) ×1 IMPLANT
GOWN STRL REUS W/ TWL XL LVL3 (GOWN DISPOSABLE) ×1 IMPLANT
GOWN STRL REUS W/TWL LRG LVL3 (GOWN DISPOSABLE) ×2
GOWN STRL REUS W/TWL XL LVL3 (GOWN DISPOSABLE) ×2
KIT BASIN OR (CUSTOM PROCEDURE TRAY) ×2 IMPLANT
KIT MARKER MARGIN INK (KITS) ×2 IMPLANT
NDL HYPO 25GX1X1/2 BEV (NEEDLE) ×1 IMPLANT
NEEDLE HYPO 25GX1X1/2 BEV (NEEDLE) ×2 IMPLANT
NS IRRIG 1000ML POUR BTL (IV SOLUTION) ×1 IMPLANT
PACK SURGICAL SETUP 50X90 (CUSTOM PROCEDURE TRAY) ×2 IMPLANT
PENCIL BUTTON HOLSTER BLD 10FT (ELECTRODE) ×2 IMPLANT
SPONGE LAP 18X18 X RAY DECT (DISPOSABLE) ×2 IMPLANT
SUT MNCRL AB 4-0 PS2 18 (SUTURE) ×2 IMPLANT
SUT VIC AB 3-0 SH 8-18 (SUTURE) ×2 IMPLANT
SYR BULB 3OZ (MISCELLANEOUS) ×2 IMPLANT
SYR CONTROL 10ML LL (SYRINGE) ×2 IMPLANT
TOWEL OR 17X24 6PK STRL BLUE (TOWEL DISPOSABLE) ×2 IMPLANT
TOWEL OR 17X26 10 PK STRL BLUE (TOWEL DISPOSABLE) ×2 IMPLANT
TUBE CONNECTING 12X1/4 (SUCTIONS) ×2 IMPLANT
YANKAUER SUCT BULB TIP NO VENT (SUCTIONS) ×2 IMPLANT

## 2016-03-13 NOTE — Discharge Instructions (Signed)
[facility] - DISCHARGE INSTRUCTIONS TO PATIENT  Activity:  Driving - May drive in 2 or 3 days   Lifting - No lifting more than 15 pounds for 5 days, then no limit  Wound Care:   Leave bandage on for 2 days.  Then remove the bandage and may shower.  Diet:  As tolerated  Follow up appointment:  Call Dr. Pollie Friar office [facility]) at [phone] for an appointment in 2 - 3 weeks.  Medications and dosages:  Resume your home medications.  You have a prescription for:  Ultram  Call Dr. Lucia Gaskins or his office  [phone]) if you have:  Temperature greater than 100.4,  Persistent nausea and vomiting,  Severe uncontrolled pain,  Redness, tenderness, or signs of infection (pain, swelling, redness, odor or green/yellow discharge around the site),  Difficulty breathing, headache or visual disturbances,  Any other questions or concerns you may have after discharge.  In an emergency, call 911 or go to an Emergency Department at a nearby hospital.

## 2016-03-13 NOTE — Interval H&P Note (Signed)
History and Physical Interval Note:  03/13/2016 7:11 AM  Alexandra Francis  has presented today for surgery, with the diagnosis of RIGHT BREAST CANCER  The various methods of treatment have been discussed with the patient and family.  Daughter, Sharyn Lull, at bedside.  After consideration of risks, benefits and other options for treatment, the patient has consented to  Procedure(s): RIGHT BREAST LUMPECTOMY WITH RADIOACTIVE SEED LOCALIZATION (Right) as a surgical intervention .  The patient's history has been reviewed, patient examined, no change in status, stable for surgery.  I have reviewed the patient's chart and labs.  Questions were answered to the patient's satisfaction.     , H

## 2016-03-13 NOTE — Op Note (Signed)
03/13/2016  8:44 AM  PATIENT:  Alexandra Francis DOB: 14-Jan-1934 MRN: [id number]  PREOP DIAGNOSIS:   RIGHT BREAST CANCER  POSTOP DIAGNOSIS:    Right breast cancer, Ductal Carcinoma in Situ, 3 o'clock position (Tis, N0)  PROCEDURE:   Procedure(s): RIGHT BREAST LUMPECTOMY WITH RADIOACTIVE SEED LOCALIZATION,   SURGEON:   Alphonsa Overall, M.D.  ANESTHESIA:   general  Anesthesiologist: Effie Berkshire, MD CRNA: Valda Favia, CRNA; Neldon Newport, CRNA  General  EBL:  minimal  ml  DRAINS:  none   LOCAL MEDICATIONS USED:   30 cc 1/2% marcaine  SPECIMEN:   Right breast lumpectomy (painted)  COUNTS CORRECT:  YES  INDICATIONS FOR PROCEDURE:  Alexandra Francis is a 82 y.o. (DOB: Aug 14, 1934) AA female whose primary care physician is Alexandra Bud, MD and comes for right breast lumpectomy .   The options for breast cancer treatment have been discussed with the patient. She elected to proceed with right breast lumpectomy.  She has had prior right breast cancer - and now she has a 0.6 cm mass in the right breast at 3 o'clock which on biopsy is DCIS.    The indications and potential complications of surgery were explained to the patient. Potential complications include, but are not limited to, bleeding, infection, the need for further surgery, and nerve injury.     She had a I131 seed placed on 03/11/2016 in her right breast at [facility].  I confirmed the presence of the I131 seed in the pre op area using the Neoprobe.  The seed is in the 3 o'clock position of the right breast.  OPERATIVE NOTE:   The patient was taken to room # 2 at [facility] where she underwent a general anesthesia  supervised by Anesthesiologist: Effie Berkshire, MD CRNA: Valda Favia, CRNA; Neldon Newport, CRNA. Her right breast was prepped with  ChloraPrep and sterilely draped.    A time-out and the surgical check list was reviewed.    I turned attention to the cancer which was about at the 3 o'clock position of  the right breast.   I used the Neoprobe to identify the I131 seed.  I tried to excise an area around the tumor of at least 1 cm.    I excised this block of breast tissue approximately 3 cm by 4 cm  in diameter.  I took the excision down to the chest wall.   I painted the lumpectomy specimen with the 6 color paint kit and did a specimen mammogram which confirmed the mass, clip, and the seed were all in the right position in the specimen.  The specimen was sent to pathology who called back to confirm that they have the seed and the specimen.   I then irrigated the wound with saline. I infiltrated approximately 30 mL of 1/2% Marcaine between the incisions.   I then closed all the wounds in layers using 3-0 Vicryl sutures for the deep layer. At the skin, I closed the incisions with a 4-0 Monocryl suture. The incisions were then painted with Dermabond.  She had gauze place over the wounds and placed in a breast binder.   The patient tolerated the procedure well, was transported to the recovery room in good condition. Sponge and needle count were correct at the end of the case.   Final pathology is pending.   Alphonsa Overall, MD, [facility] Pager: [phone] Office phone:  [phone]

## 2016-03-13 NOTE — Anesthesia Postprocedure Evaluation (Addendum)
Anesthesia Post Note  Patient: Alexandra Francis  Procedure(s) Performed: Procedure(s) (LRB): RIGHT BREAST LUMPECTOMY WITH RADIOACTIVE SEED LOCALIZATION (Right)  Patient location during evaluation: PACU Anesthesia Type: General Level of consciousness: awake and alert Pain management: pain level controlled Vital Signs Assessment: post-procedure vital signs reviewed and stable Respiratory status: spontaneous breathing, nonlabored ventilation, respiratory function stable and patient connected to nasal cannula oxygen Cardiovascular status: blood pressure returned to baseline and stable Postop Assessment: no signs of nausea or vomiting Anesthetic complications: no       Last Vitals:  Vitals:   03/13/16 0945 03/13/16 0953  BP: (!) 164/66 (!) 174/61  Pulse: 74 74  Resp: 16 16  Temp: 36.3 C     Last Pain:  Vitals:   03/13/16 0953  TempSrc:   PainSc: 0-No pain                 Effie Berkshire

## 2016-03-13 NOTE — Transfer of Care (Signed)
Immediate Anesthesia Transfer of Care Note  Patient: Alexandra Francis  Procedure(s) Performed: Procedure(s): RIGHT BREAST LUMPECTOMY WITH RADIOACTIVE SEED LOCALIZATION (Right)  Patient Location: PACU  Anesthesia Type:General  Level of Consciousness: awake, alert  and oriented  Airway & Oxygen Therapy: Patient Spontanous Breathing and Patient connected to nasal cannula oxygen  Post-op Assessment: Report given to RN, Post -op Vital signs reviewed and stable and Patient moving all extremities X 4  Post vital signs: Reviewed and stable  Last Vitals:  Vitals:   03/13/16 0638  BP: (!) 187/65  Pulse: 72  Resp: 18  Temp: 36.7 C    Last Pain:  Vitals:   03/13/16 0638  TempSrc: Oral      Patients Stated Pain Goal: 8 ([phone] [id number])  Complications: No apparent anesthesia complications

## 2016-03-13 NOTE — Anesthesia Procedure Notes (Signed)
Procedure Name: LMA Insertion Date/Time: 03/13/2016 7:43 AM Performed by: Neldon Newport Pre-anesthesia Checklist: Timeout performed, Patient being monitored, Suction available, Emergency Drugs available and Patient identified Patient Re-evaluated:Patient Re-evaluated prior to inductionOxygen Delivery Method: Circle system utilized Preoxygenation: Pre-oxygenation with 100% oxygen Intubation Type: IV induction Ventilation: Mask ventilation without difficulty LMA: LMA inserted LMA Size: 4.0 Tube type: Oral Number of attempts: 2 Placement Confirmation: breath sounds checked- equal and bilateral and positive ETCO2 Dental Injury: Teeth and Oropharynx as per pre-operative assessment

## 2016-03-14 ENCOUNTER — Encounter (HOSPITAL_COMMUNITY): Payer: Self-pay | Admitting: Surgery

## 2016-03-27 DIAGNOSIS — H4089 Other specified glaucoma: Secondary | ICD-10-CM | POA: Diagnosis not present

## 2016-03-27 DIAGNOSIS — H1851 Endothelial corneal dystrophy: Secondary | ICD-10-CM | POA: Diagnosis not present

## 2016-03-27 DIAGNOSIS — Z947 Corneal transplant status: Secondary | ICD-10-CM | POA: Diagnosis not present

## 2016-04-06 DIAGNOSIS — E1165 Type 2 diabetes mellitus with hyperglycemia: Secondary | ICD-10-CM | POA: Diagnosis not present

## 2016-04-06 DIAGNOSIS — E1140 Type 2 diabetes mellitus with diabetic neuropathy, unspecified: Secondary | ICD-10-CM | POA: Diagnosis not present

## 2016-04-16 DIAGNOSIS — R69 Illness, unspecified: Secondary | ICD-10-CM | POA: Diagnosis not present

## 2016-05-01 DIAGNOSIS — I10 Essential (primary) hypertension: Secondary | ICD-10-CM | POA: Diagnosis not present

## 2016-05-01 DIAGNOSIS — E1140 Type 2 diabetes mellitus with diabetic neuropathy, unspecified: Secondary | ICD-10-CM | POA: Diagnosis not present

## 2016-05-01 DIAGNOSIS — E7800 Pure hypercholesterolemia, unspecified: Secondary | ICD-10-CM | POA: Diagnosis not present

## 2016-05-01 DIAGNOSIS — Z0000 Encounter for general adult medical examination without abnormal findings: Secondary | ICD-10-CM | POA: Diagnosis not present

## 2016-05-01 DIAGNOSIS — M8580 Other specified disorders of bone density and structure, unspecified site: Secondary | ICD-10-CM | POA: Diagnosis not present

## 2016-05-01 DIAGNOSIS — M899 Disorder of bone, unspecified: Secondary | ICD-10-CM | POA: Diagnosis not present

## 2016-05-18 DIAGNOSIS — Z6835 Body mass index (BMI) 35.0-35.9, adult: Secondary | ICD-10-CM | POA: Diagnosis not present

## 2016-05-18 DIAGNOSIS — Z794 Long term (current) use of insulin: Secondary | ICD-10-CM | POA: Diagnosis not present

## 2016-05-18 DIAGNOSIS — I10 Essential (primary) hypertension: Secondary | ICD-10-CM | POA: Diagnosis not present

## 2016-05-18 DIAGNOSIS — Z947 Corneal transplant status: Secondary | ICD-10-CM | POA: Diagnosis not present

## 2016-05-18 DIAGNOSIS — E1140 Type 2 diabetes mellitus with diabetic neuropathy, unspecified: Secondary | ICD-10-CM | POA: Diagnosis not present

## 2016-05-18 DIAGNOSIS — Z79899 Other long term (current) drug therapy: Secondary | ICD-10-CM | POA: Diagnosis not present

## 2016-05-18 DIAGNOSIS — R600 Localized edema: Secondary | ICD-10-CM | POA: Diagnosis not present

## 2016-05-18 DIAGNOSIS — Z9849 Cataract extraction status, unspecified eye: Secondary | ICD-10-CM | POA: Diagnosis not present

## 2016-05-18 DIAGNOSIS — E669 Obesity, unspecified: Secondary | ICD-10-CM | POA: Diagnosis not present

## 2016-05-18 DIAGNOSIS — H409 Unspecified glaucoma: Secondary | ICD-10-CM | POA: Diagnosis not present

## 2016-05-18 DIAGNOSIS — E785 Hyperlipidemia, unspecified: Secondary | ICD-10-CM | POA: Diagnosis not present

## 2016-05-18 DIAGNOSIS — Z0000 Encounter for general adult medical examination without abnormal findings: Secondary | ICD-10-CM | POA: Diagnosis not present

## 2016-06-10 DIAGNOSIS — R69 Illness, unspecified: Secondary | ICD-10-CM | POA: Diagnosis not present

## 2016-06-12 NOTE — Addendum Note (Signed)
Addendum  created 06/12/16 1055 by Effie Berkshire, MD   Sign clinical note

## 2016-06-22 DIAGNOSIS — R69 Illness, unspecified: Secondary | ICD-10-CM | POA: Diagnosis not present

## 2016-06-29 DIAGNOSIS — R69 Illness, unspecified: Secondary | ICD-10-CM | POA: Diagnosis not present

## 2016-07-02 DIAGNOSIS — E119 Type 2 diabetes mellitus without complications: Secondary | ICD-10-CM | POA: Diagnosis not present

## 2016-07-02 DIAGNOSIS — Z8542 Personal history of malignant neoplasm of other parts of uterus: Secondary | ICD-10-CM | POA: Diagnosis not present

## 2016-07-02 DIAGNOSIS — E669 Obesity, unspecified: Secondary | ICD-10-CM | POA: Diagnosis not present

## 2016-07-02 DIAGNOSIS — Z9889 Other specified postprocedural states: Secondary | ICD-10-CM | POA: Diagnosis not present

## 2016-07-02 DIAGNOSIS — Z86000 Personal history of in-situ neoplasm of breast: Secondary | ICD-10-CM | POA: Diagnosis not present

## 2016-07-02 DIAGNOSIS — Z853 Personal history of malignant neoplasm of breast: Secondary | ICD-10-CM | POA: Diagnosis not present

## 2016-07-02 DIAGNOSIS — Z9079 Acquired absence of other genital organ(s): Secondary | ICD-10-CM | POA: Diagnosis not present

## 2016-07-02 DIAGNOSIS — C549 Malignant neoplasm of corpus uteri, unspecified: Secondary | ICD-10-CM | POA: Diagnosis not present

## 2016-07-02 DIAGNOSIS — Z90710 Acquired absence of both cervix and uterus: Secondary | ICD-10-CM | POA: Diagnosis not present

## 2016-07-02 DIAGNOSIS — Z90722 Acquired absence of ovaries, bilateral: Secondary | ICD-10-CM | POA: Diagnosis not present

## 2016-07-02 DIAGNOSIS — Z08 Encounter for follow-up examination after completed treatment for malignant neoplasm: Secondary | ICD-10-CM | POA: Diagnosis not present

## 2016-08-06 DIAGNOSIS — R69 Illness, unspecified: Secondary | ICD-10-CM | POA: Diagnosis not present

## 2016-09-28 DIAGNOSIS — R69 Illness, unspecified: Secondary | ICD-10-CM | POA: Diagnosis not present

## 2016-10-02 DIAGNOSIS — H1851 Endothelial corneal dystrophy: Secondary | ICD-10-CM | POA: Diagnosis not present

## 2016-10-08 DIAGNOSIS — Z794 Long term (current) use of insulin: Secondary | ICD-10-CM | POA: Diagnosis not present

## 2016-10-08 DIAGNOSIS — Z23 Encounter for immunization: Secondary | ICD-10-CM | POA: Diagnosis not present

## 2016-10-08 DIAGNOSIS — E1165 Type 2 diabetes mellitus with hyperglycemia: Secondary | ICD-10-CM | POA: Diagnosis not present

## 2016-10-08 DIAGNOSIS — I10 Essential (primary) hypertension: Secondary | ICD-10-CM | POA: Diagnosis not present

## 2016-10-08 DIAGNOSIS — R69 Illness, unspecified: Secondary | ICD-10-CM | POA: Diagnosis not present

## 2016-10-08 DIAGNOSIS — E1140 Type 2 diabetes mellitus with diabetic neuropathy, unspecified: Secondary | ICD-10-CM | POA: Diagnosis not present

## 2016-10-12 DIAGNOSIS — R69 Illness, unspecified: Secondary | ICD-10-CM | POA: Diagnosis not present

## 2016-10-23 DIAGNOSIS — H02831 Dermatochalasis of right upper eyelid: Secondary | ICD-10-CM | POA: Diagnosis not present

## 2016-10-23 DIAGNOSIS — H02834 Dermatochalasis of left upper eyelid: Secondary | ICD-10-CM | POA: Diagnosis not present

## 2016-10-29 DIAGNOSIS — M8580 Other specified disorders of bone density and structure, unspecified site: Secondary | ICD-10-CM | POA: Diagnosis not present

## 2016-10-29 DIAGNOSIS — E1140 Type 2 diabetes mellitus with diabetic neuropathy, unspecified: Secondary | ICD-10-CM | POA: Diagnosis not present

## 2016-10-29 DIAGNOSIS — I10 Essential (primary) hypertension: Secondary | ICD-10-CM | POA: Diagnosis not present

## 2016-10-29 DIAGNOSIS — E7800 Pure hypercholesterolemia, unspecified: Secondary | ICD-10-CM | POA: Diagnosis not present

## 2016-11-15 DIAGNOSIS — R69 Illness, unspecified: Secondary | ICD-10-CM | POA: Diagnosis not present

## 2016-11-30 DIAGNOSIS — E785 Hyperlipidemia, unspecified: Secondary | ICD-10-CM | POA: Diagnosis not present

## 2016-11-30 DIAGNOSIS — H02831 Dermatochalasis of right upper eyelid: Secondary | ICD-10-CM | POA: Diagnosis not present

## 2016-11-30 DIAGNOSIS — E669 Obesity, unspecified: Secondary | ICD-10-CM | POA: Diagnosis not present

## 2016-11-30 DIAGNOSIS — Z794 Long term (current) use of insulin: Secondary | ICD-10-CM | POA: Diagnosis not present

## 2016-11-30 DIAGNOSIS — K219 Gastro-esophageal reflux disease without esophagitis: Secondary | ICD-10-CM | POA: Diagnosis not present

## 2016-11-30 DIAGNOSIS — Z885 Allergy status to narcotic agent status: Secondary | ICD-10-CM | POA: Diagnosis not present

## 2016-11-30 DIAGNOSIS — E1142 Type 2 diabetes mellitus with diabetic polyneuropathy: Secondary | ICD-10-CM | POA: Diagnosis not present

## 2016-11-30 DIAGNOSIS — H02834 Dermatochalasis of left upper eyelid: Secondary | ICD-10-CM | POA: Diagnosis not present

## 2016-11-30 DIAGNOSIS — I10 Essential (primary) hypertension: Secondary | ICD-10-CM | POA: Diagnosis not present

## 2016-11-30 DIAGNOSIS — Z882 Allergy status to sulfonamides status: Secondary | ICD-10-CM | POA: Diagnosis not present

## 2017-01-14 DIAGNOSIS — R69 Illness, unspecified: Secondary | ICD-10-CM | POA: Diagnosis not present

## 2017-02-04 DIAGNOSIS — Z853 Personal history of malignant neoplasm of breast: Secondary | ICD-10-CM | POA: Diagnosis not present

## 2017-02-04 DIAGNOSIS — Z90722 Acquired absence of ovaries, bilateral: Secondary | ICD-10-CM | POA: Diagnosis not present

## 2017-02-04 DIAGNOSIS — Z90710 Acquired absence of both cervix and uterus: Secondary | ICD-10-CM | POA: Diagnosis not present

## 2017-02-04 DIAGNOSIS — Z8542 Personal history of malignant neoplasm of other parts of uterus: Secondary | ICD-10-CM | POA: Diagnosis not present

## 2017-02-04 DIAGNOSIS — E669 Obesity, unspecified: Secondary | ICD-10-CM | POA: Diagnosis not present

## 2017-02-04 DIAGNOSIS — Z9079 Acquired absence of other genital organ(s): Secondary | ICD-10-CM | POA: Diagnosis not present

## 2017-02-04 DIAGNOSIS — Z9889 Other specified postprocedural states: Secondary | ICD-10-CM | POA: Diagnosis not present

## 2017-02-04 DIAGNOSIS — Z86000 Personal history of in-situ neoplasm of breast: Secondary | ICD-10-CM | POA: Diagnosis not present

## 2017-02-04 DIAGNOSIS — Z08 Encounter for follow-up examination after completed treatment for malignant neoplasm: Secondary | ICD-10-CM | POA: Diagnosis not present

## 2017-02-04 DIAGNOSIS — C55 Malignant neoplasm of uterus, part unspecified: Secondary | ICD-10-CM | POA: Diagnosis not present

## 2017-02-05 DIAGNOSIS — Z0100 Encounter for examination of eyes and vision without abnormal findings: Secondary | ICD-10-CM | POA: Diagnosis not present

## 2017-02-15 DIAGNOSIS — R69 Illness, unspecified: Secondary | ICD-10-CM | POA: Diagnosis not present

## 2017-02-22 ENCOUNTER — Other Ambulatory Visit: Payer: Self-pay | Admitting: Surgery

## 2017-02-22 DIAGNOSIS — Z9889 Other specified postprocedural states: Secondary | ICD-10-CM

## 2017-03-16 ENCOUNTER — Ambulatory Visit
Admission: RE | Admit: 2017-03-16 | Discharge: 2017-03-16 | Disposition: A | Discharge status: Home / Self Care | Payer: Medicare HMO | Source: Ambulatory Visit | Attending: Surgery | Admitting: Surgery

## 2017-03-16 DIAGNOSIS — Z9889 Other specified postprocedural states: Secondary | ICD-10-CM

## 2017-03-16 DIAGNOSIS — R928 Other abnormal and inconclusive findings on diagnostic imaging of breast: Secondary | ICD-10-CM | POA: Diagnosis not present

## 2017-03-16 HISTORY — DX: Malignant neoplasm of unspecified site of unspecified female breast: C50.919

## 2017-03-19 DIAGNOSIS — R69 Illness, unspecified: Secondary | ICD-10-CM | POA: Diagnosis not present

## 2017-04-08 DIAGNOSIS — E1140 Type 2 diabetes mellitus with diabetic neuropathy, unspecified: Secondary | ICD-10-CM | POA: Diagnosis not present

## 2017-04-08 DIAGNOSIS — I10 Essential (primary) hypertension: Secondary | ICD-10-CM | POA: Diagnosis not present

## 2017-04-08 DIAGNOSIS — E1165 Type 2 diabetes mellitus with hyperglycemia: Secondary | ICD-10-CM | POA: Diagnosis not present

## 2017-04-09 DIAGNOSIS — R69 Illness, unspecified: Secondary | ICD-10-CM | POA: Diagnosis not present

## 2017-04-21 DIAGNOSIS — D0591 Unspecified type of carcinoma in situ of right breast: Secondary | ICD-10-CM | POA: Diagnosis not present

## 2017-04-21 DIAGNOSIS — Z853 Personal history of malignant neoplasm of breast: Secondary | ICD-10-CM | POA: Diagnosis not present

## 2017-04-26 DIAGNOSIS — G8929 Other chronic pain: Secondary | ICD-10-CM | POA: Diagnosis not present

## 2017-04-26 DIAGNOSIS — E1142 Type 2 diabetes mellitus with diabetic polyneuropathy: Secondary | ICD-10-CM | POA: Diagnosis not present

## 2017-04-26 DIAGNOSIS — H409 Unspecified glaucoma: Secondary | ICD-10-CM | POA: Diagnosis not present

## 2017-04-26 DIAGNOSIS — E785 Hyperlipidemia, unspecified: Secondary | ICD-10-CM | POA: Diagnosis not present

## 2017-04-26 DIAGNOSIS — M1990 Unspecified osteoarthritis, unspecified site: Secondary | ICD-10-CM | POA: Diagnosis not present

## 2017-04-26 DIAGNOSIS — I10 Essential (primary) hypertension: Secondary | ICD-10-CM | POA: Diagnosis not present

## 2017-04-26 DIAGNOSIS — Z794 Long term (current) use of insulin: Secondary | ICD-10-CM | POA: Diagnosis not present

## 2017-04-26 DIAGNOSIS — E1165 Type 2 diabetes mellitus with hyperglycemia: Secondary | ICD-10-CM | POA: Diagnosis not present

## 2017-04-26 DIAGNOSIS — E669 Obesity, unspecified: Secondary | ICD-10-CM | POA: Diagnosis not present

## 2017-04-26 DIAGNOSIS — K5900 Constipation, unspecified: Secondary | ICD-10-CM | POA: Diagnosis not present

## 2017-05-25 DIAGNOSIS — R5383 Other fatigue: Secondary | ICD-10-CM | POA: Diagnosis not present

## 2017-05-25 DIAGNOSIS — E1140 Type 2 diabetes mellitus with diabetic neuropathy, unspecified: Secondary | ICD-10-CM | POA: Diagnosis not present

## 2017-05-25 DIAGNOSIS — E7800 Pure hypercholesterolemia, unspecified: Secondary | ICD-10-CM | POA: Diagnosis not present

## 2017-05-25 DIAGNOSIS — Z0000 Encounter for general adult medical examination without abnormal findings: Secondary | ICD-10-CM | POA: Diagnosis not present

## 2017-05-25 DIAGNOSIS — M8580 Other specified disorders of bone density and structure, unspecified site: Secondary | ICD-10-CM | POA: Diagnosis not present

## 2017-05-25 DIAGNOSIS — I10 Essential (primary) hypertension: Secondary | ICD-10-CM | POA: Diagnosis not present

## 2017-05-25 DIAGNOSIS — M899 Disorder of bone, unspecified: Secondary | ICD-10-CM | POA: Diagnosis not present

## 2017-05-26 DIAGNOSIS — R69 Illness, unspecified: Secondary | ICD-10-CM | POA: Diagnosis not present

## 2017-05-31 DIAGNOSIS — M899 Disorder of bone, unspecified: Secondary | ICD-10-CM | POA: Diagnosis not present

## 2017-05-31 DIAGNOSIS — E7800 Pure hypercholesterolemia, unspecified: Secondary | ICD-10-CM | POA: Diagnosis not present

## 2017-05-31 DIAGNOSIS — I10 Essential (primary) hypertension: Secondary | ICD-10-CM | POA: Diagnosis not present

## 2017-05-31 DIAGNOSIS — E1140 Type 2 diabetes mellitus with diabetic neuropathy, unspecified: Secondary | ICD-10-CM | POA: Diagnosis not present

## 2017-05-31 DIAGNOSIS — M8580 Other specified disorders of bone density and structure, unspecified site: Secondary | ICD-10-CM | POA: Diagnosis not present

## 2017-05-31 DIAGNOSIS — R5383 Other fatigue: Secondary | ICD-10-CM | POA: Diagnosis not present

## 2017-06-01 ENCOUNTER — Other Ambulatory Visit: Payer: Self-pay | Admitting: Unknown Physician Specialty

## 2017-06-01 DIAGNOSIS — Z780 Asymptomatic menopausal state: Secondary | ICD-10-CM

## 2017-06-09 ENCOUNTER — Ambulatory Visit (INDEPENDENT_AMBULATORY_CARE_PROVIDER_SITE_OTHER): Payer: Medicare HMO

## 2017-06-09 DIAGNOSIS — Z780 Asymptomatic menopausal state: Secondary | ICD-10-CM

## 2017-06-09 DIAGNOSIS — M85851 Other specified disorders of bone density and structure, right thigh: Secondary | ICD-10-CM | POA: Diagnosis not present

## 2017-06-27 DIAGNOSIS — R69 Illness, unspecified: Secondary | ICD-10-CM | POA: Diagnosis not present

## 2017-07-02 DIAGNOSIS — H401131 Primary open-angle glaucoma, bilateral, mild stage: Secondary | ICD-10-CM | POA: Diagnosis not present

## 2017-07-02 DIAGNOSIS — H02834 Dermatochalasis of left upper eyelid: Secondary | ICD-10-CM | POA: Diagnosis not present

## 2017-07-02 DIAGNOSIS — H1851 Endothelial corneal dystrophy: Secondary | ICD-10-CM | POA: Diagnosis not present

## 2017-07-02 DIAGNOSIS — H02831 Dermatochalasis of right upper eyelid: Secondary | ICD-10-CM | POA: Diagnosis not present

## 2017-07-19 DIAGNOSIS — R69 Illness, unspecified: Secondary | ICD-10-CM | POA: Diagnosis not present

## 2017-08-16 DIAGNOSIS — R69 Illness, unspecified: Secondary | ICD-10-CM | POA: Diagnosis not present

## 2017-09-14 DIAGNOSIS — R69 Illness, unspecified: Secondary | ICD-10-CM | POA: Diagnosis not present

## 2017-09-21 DIAGNOSIS — H4089 Other specified glaucoma: Secondary | ICD-10-CM | POA: Diagnosis not present

## 2017-09-21 DIAGNOSIS — H10013 Acute follicular conjunctivitis, bilateral: Secondary | ICD-10-CM | POA: Diagnosis not present

## 2017-09-21 DIAGNOSIS — H401111 Primary open-angle glaucoma, right eye, mild stage: Secondary | ICD-10-CM | POA: Diagnosis not present

## 2017-09-21 DIAGNOSIS — H40042 Steroid responder, left eye: Secondary | ICD-10-CM | POA: Diagnosis not present

## 2017-10-11 DIAGNOSIS — E1140 Type 2 diabetes mellitus with diabetic neuropathy, unspecified: Secondary | ICD-10-CM | POA: Diagnosis not present

## 2017-10-11 DIAGNOSIS — E1165 Type 2 diabetes mellitus with hyperglycemia: Secondary | ICD-10-CM | POA: Diagnosis not present

## 2017-10-11 DIAGNOSIS — Z23 Encounter for immunization: Secondary | ICD-10-CM | POA: Diagnosis not present

## 2017-10-12 DIAGNOSIS — R69 Illness, unspecified: Secondary | ICD-10-CM | POA: Diagnosis not present

## 2017-11-02 DIAGNOSIS — H10433 Chronic follicular conjunctivitis, bilateral: Secondary | ICD-10-CM | POA: Diagnosis not present

## 2017-11-02 DIAGNOSIS — H4089 Other specified glaucoma: Secondary | ICD-10-CM | POA: Diagnosis not present

## 2017-11-02 DIAGNOSIS — H401111 Primary open-angle glaucoma, right eye, mild stage: Secondary | ICD-10-CM | POA: Diagnosis not present

## 2017-11-02 DIAGNOSIS — H40042 Steroid responder, left eye: Secondary | ICD-10-CM | POA: Diagnosis not present

## 2017-11-22 DIAGNOSIS — R69 Illness, unspecified: Secondary | ICD-10-CM | POA: Diagnosis not present

## 2017-11-26 DIAGNOSIS — M8580 Other specified disorders of bone density and structure, unspecified site: Secondary | ICD-10-CM | POA: Diagnosis not present

## 2017-11-26 DIAGNOSIS — I10 Essential (primary) hypertension: Secondary | ICD-10-CM | POA: Diagnosis not present

## 2017-11-26 DIAGNOSIS — E7800 Pure hypercholesterolemia, unspecified: Secondary | ICD-10-CM | POA: Diagnosis not present

## 2017-11-26 DIAGNOSIS — R69 Illness, unspecified: Secondary | ICD-10-CM | POA: Diagnosis not present

## 2017-11-29 DIAGNOSIS — H10433 Chronic follicular conjunctivitis, bilateral: Secondary | ICD-10-CM | POA: Diagnosis not present

## 2018-01-12 DIAGNOSIS — R69 Illness, unspecified: Secondary | ICD-10-CM | POA: Diagnosis not present

## 2018-01-19 IMAGING — MG 2D DIGITAL DIAGNOSTIC UNILATERAL RIGHT MAMMOGRAM WITH CAD AND AD
6 series · 6 of 14 positions shown · non-contrast
Comparison: Previous exams including recent screening mammogram
dated 01/28/2016.

CLINICAL DATA: Patient returns today to evaluate a possible right
breast asymmetry identified on recent screening mammogram.

History of right breast cancer in 4551 status post breast
conservation surgery.
EXAM:
2D DIGITAL DIAGNOSTIC RIGHT MAMMOGRAM WITH CAD AND ADJUNCT TOMO
ULTRASOUND RIGHT BREAST

[R CC]
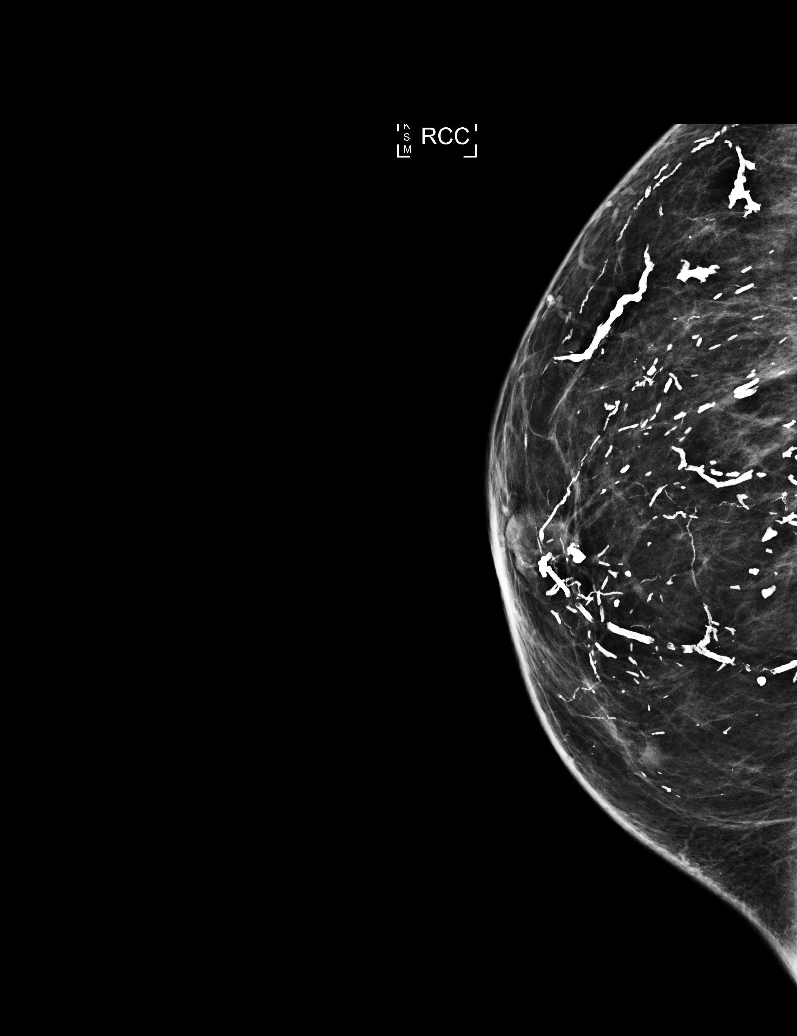

[R MLO synth-2D]
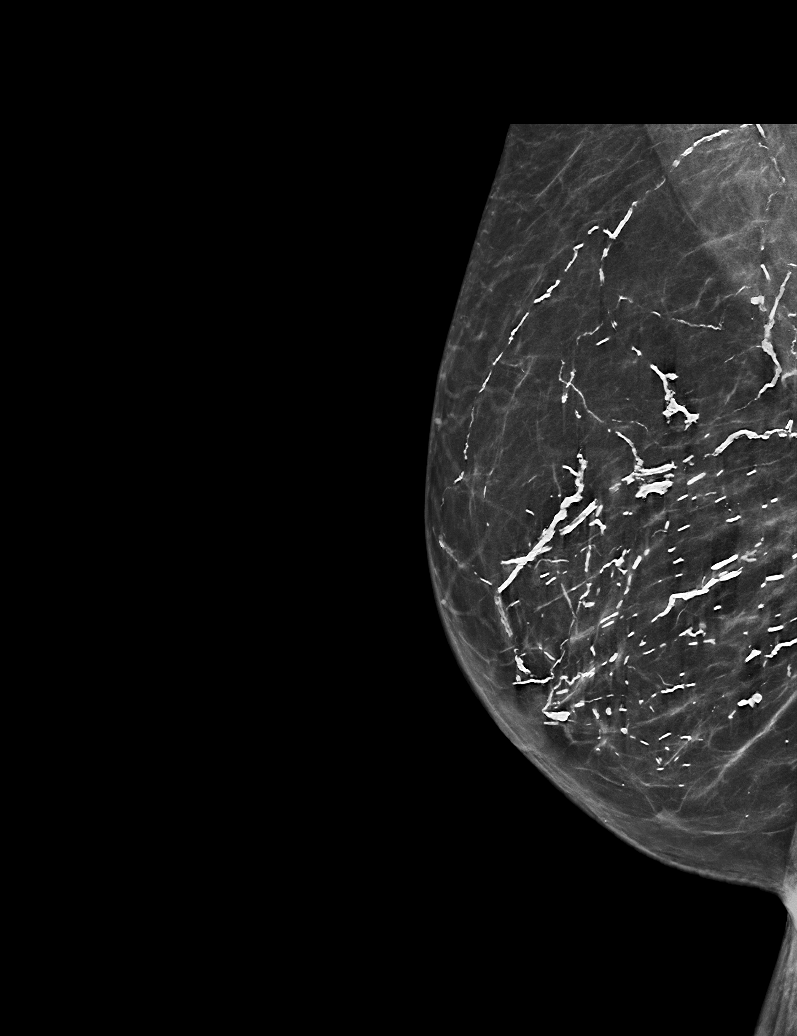

[R MLO]
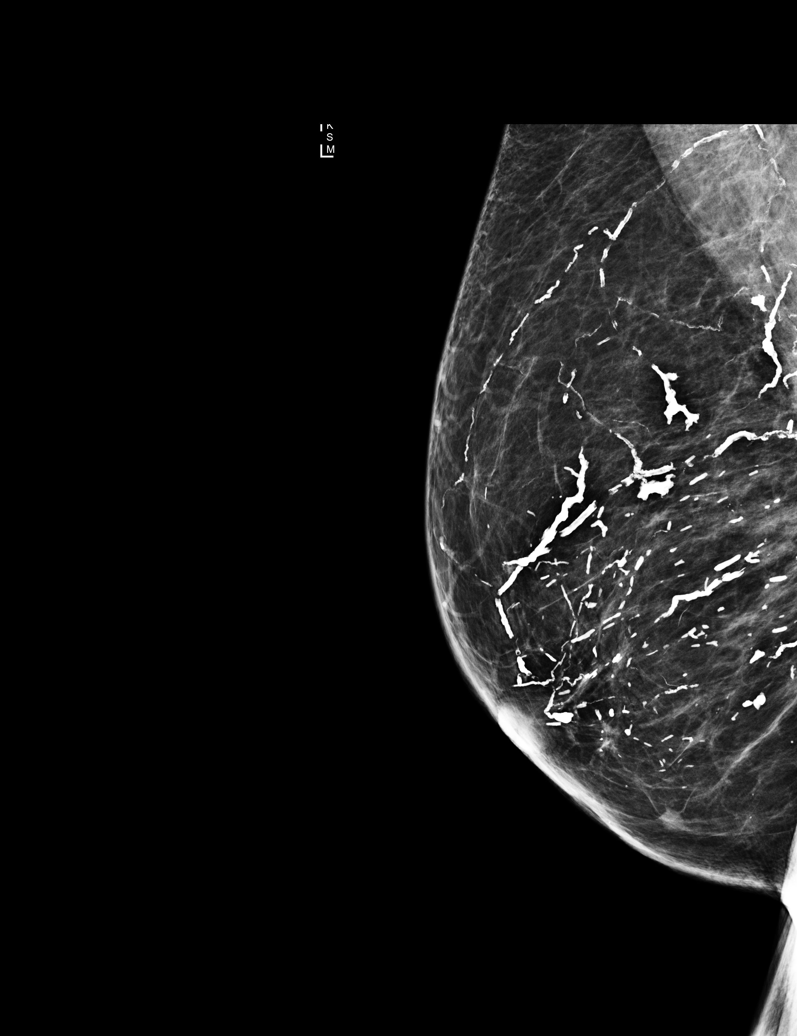

[R CC synth-2D]
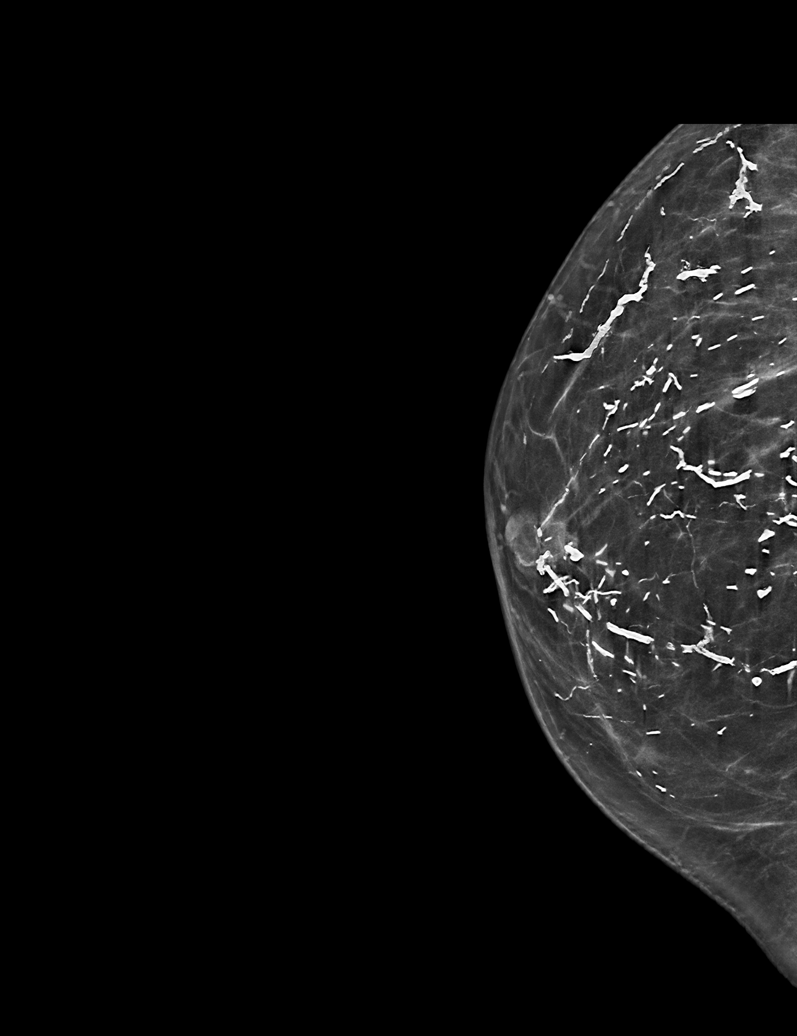

[R CC tomo · tomo slice 29/56.0]
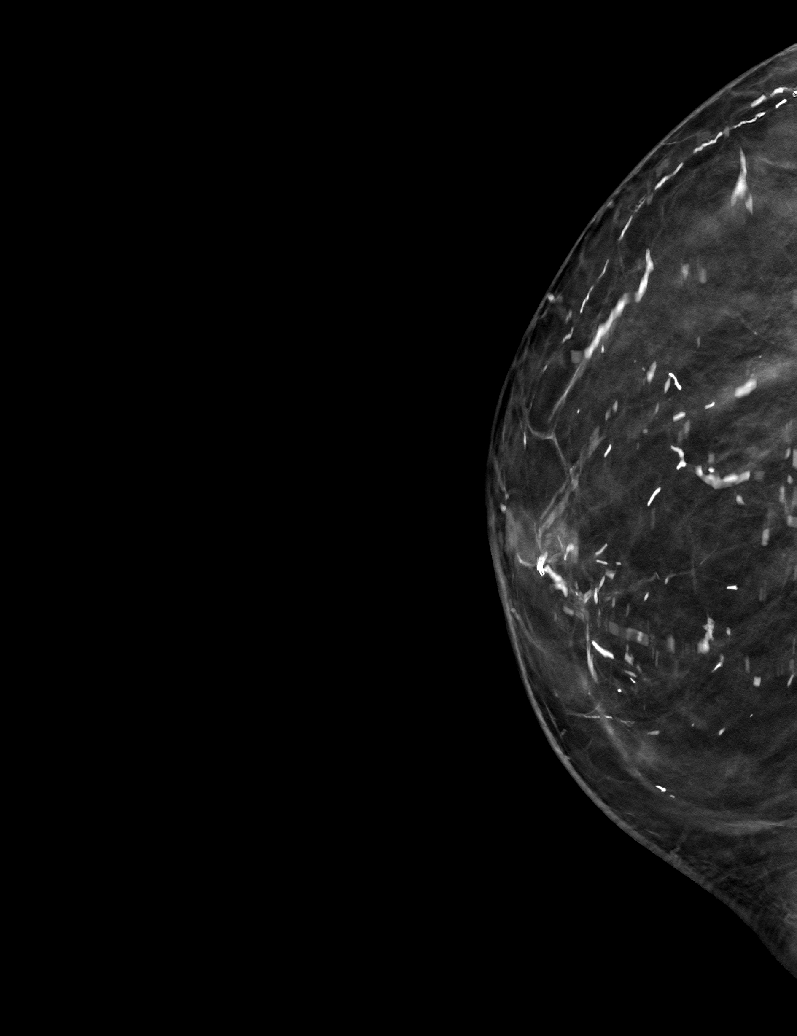

[R MLO tomo · tomo slice 32/63.0]
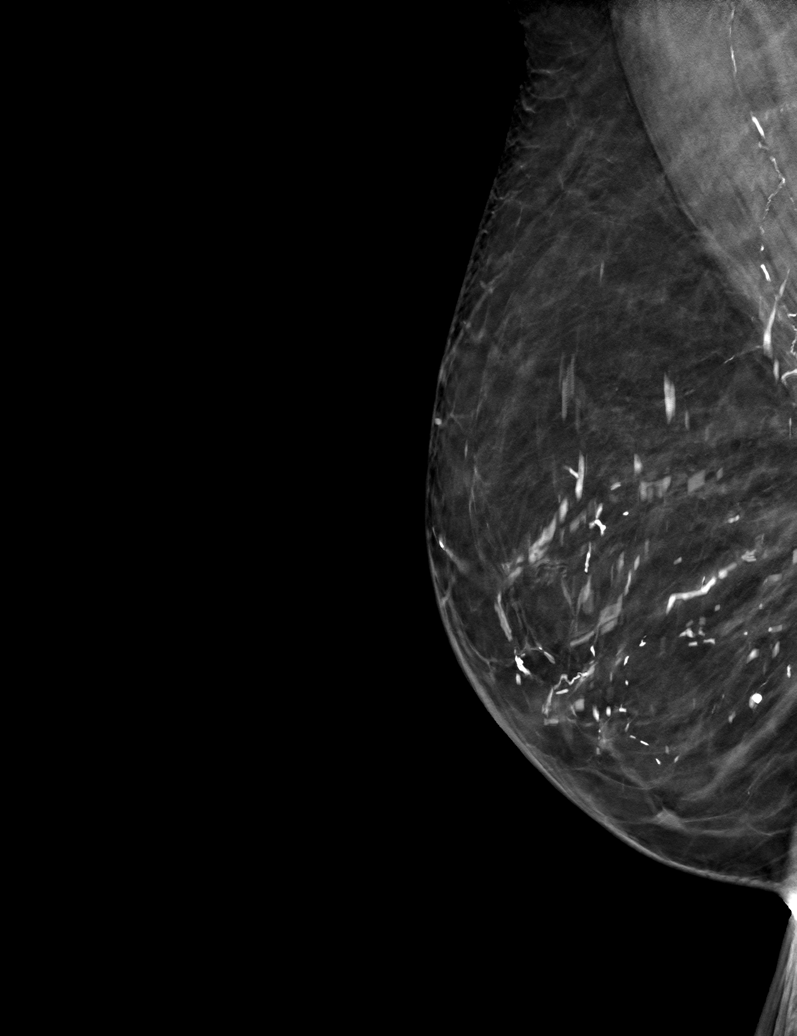

[6 of 14 positions shown; findings below may reference images not displayed]

ACR Breast Density Category b: There are scattered areas of
fibroglandular density.
FINDINGS: On today's additional views with 3D tomosynthesis, there is a
persistent asymmetry within the lower inner quadrant of the right
breast, most conspicuous on tomosynthesis MLO slice 31.

Mammographic images were processed with CAD.

Targeted ultrasound is performed, showing a complex cystic and solid
mass within the right breast at the 3 o'clock axis, 3 cm from the
nipple, measuring 6 x 3 x 4 mm, avascular, a probable correlate for
the mammographic finding.

Right axilla was evaluated with ultrasound showing no enlarged or
morphologically abnormal lymph nodes.
IMPRESSION: Complex cystic and solid mass within the right breast at the 3
o'clock axis, 3 cm from the nipple, measuring 6 x 3 x 4 mm, a
probable correlate for the mammographic asymmetry. This is a
suspicious finding for which ultrasound-guided biopsy is
recommended. Differential includes complicated cyst. If this mass
corresponds to the mammographic asymmetry, based on a postprocedure
film, location may be closer to 4 o'clock axis.

RECOMMENDATION:
1. Ultrasound-guided biopsy for the complex cystic and solid mass
within the right breast at the 3 o'clock axis.

2. Postprocedure mammogram to ensure mammographic and sonographic
correspondence. If findings do not correspond, additional
stereotactic biopsy of the mammographic finding is recommended.

Ultrasound-guided biopsy is scheduled for [REDACTED].

I have discussed the findings and recommendations with the patient.
Results were also provided in writing at the conclusion of the
visit. If applicable, a reminder letter will be sent to the patient
regarding the next appointment.

BI-RADS CATEGORY  4: Suspicious.

## 2018-01-19 IMAGING — US ULTRASOUND RIGHT BREAST LIMITED
1 series · 7 of 7 positions shown · non-contrast
Comparison: Previous exams including recent screening mammogram
dated 01/28/2016.

CLINICAL DATA: Patient returns today to evaluate a possible right
breast asymmetry identified on recent screening mammogram.

History of right breast cancer in 4551 status post breast
conservation surgery.
EXAM:
2D DIGITAL DIAGNOSTIC RIGHT MAMMOGRAM WITH CAD AND ADJUNCT TOMO
ULTRASOUND RIGHT BREAST

[Series 1: ultrasound right breast limited · 0.04mm/px · 7 of 7 slices shown]
[im 1/7]
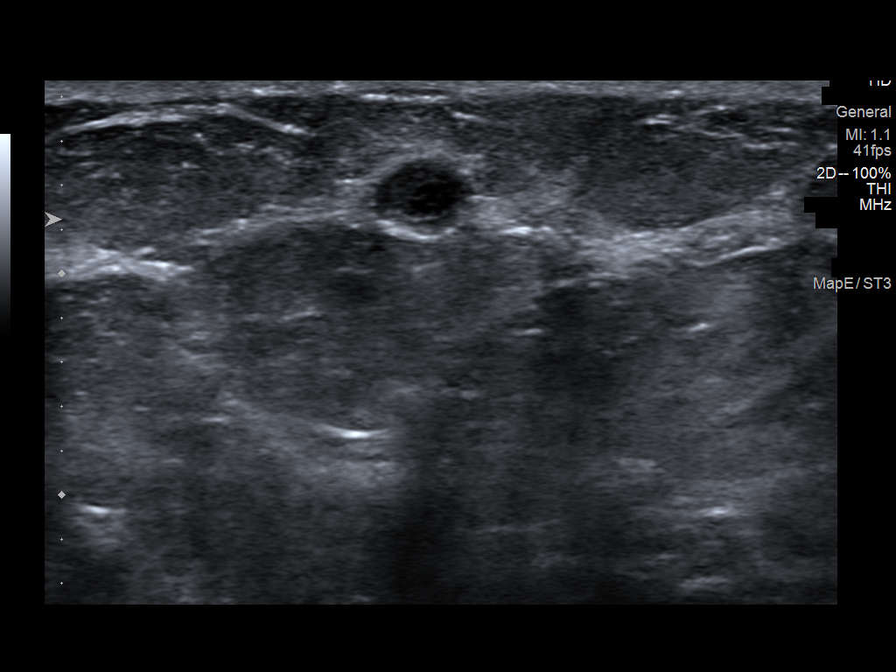
[im 2/7]
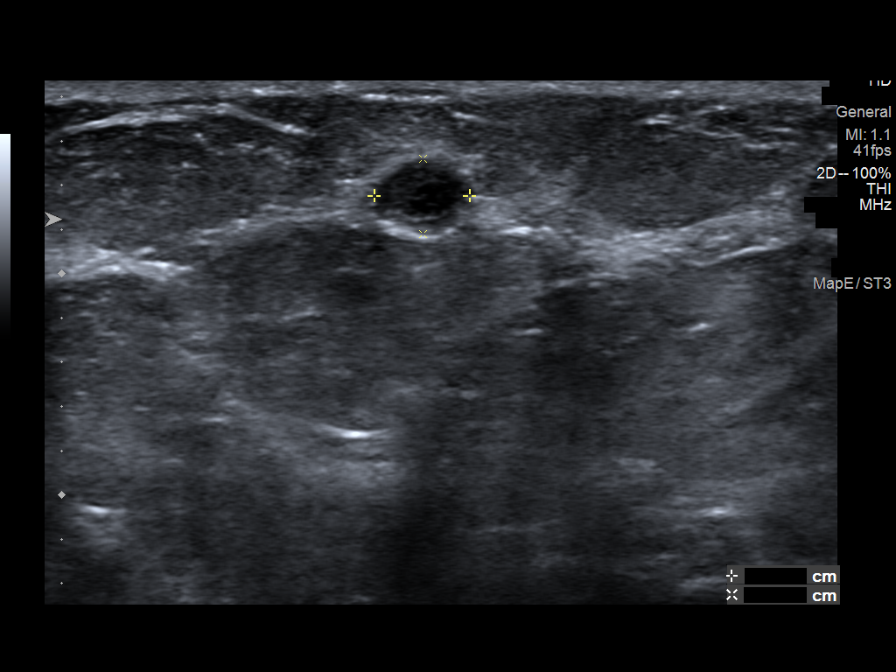
[im 3/7]
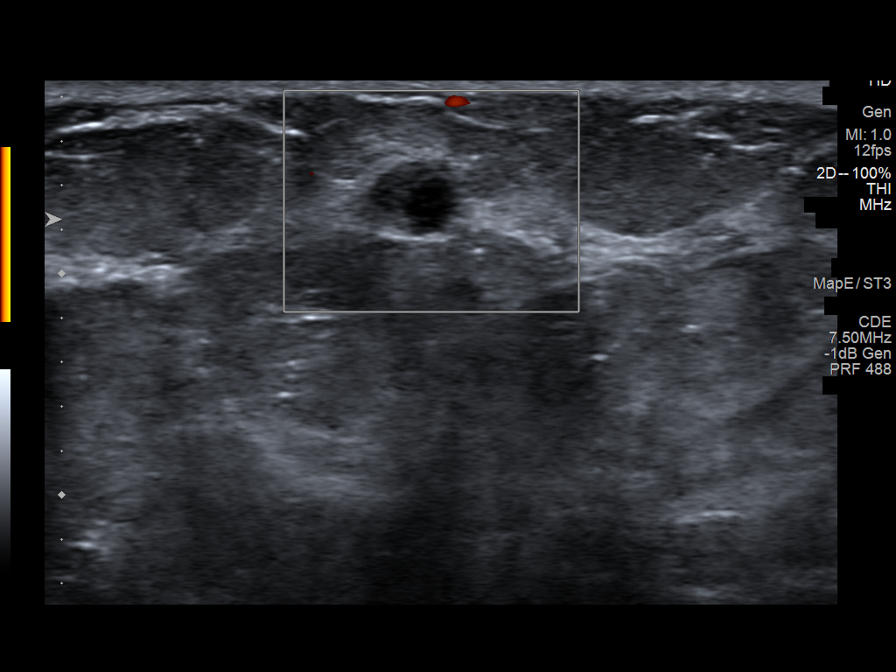
[im 4/7]
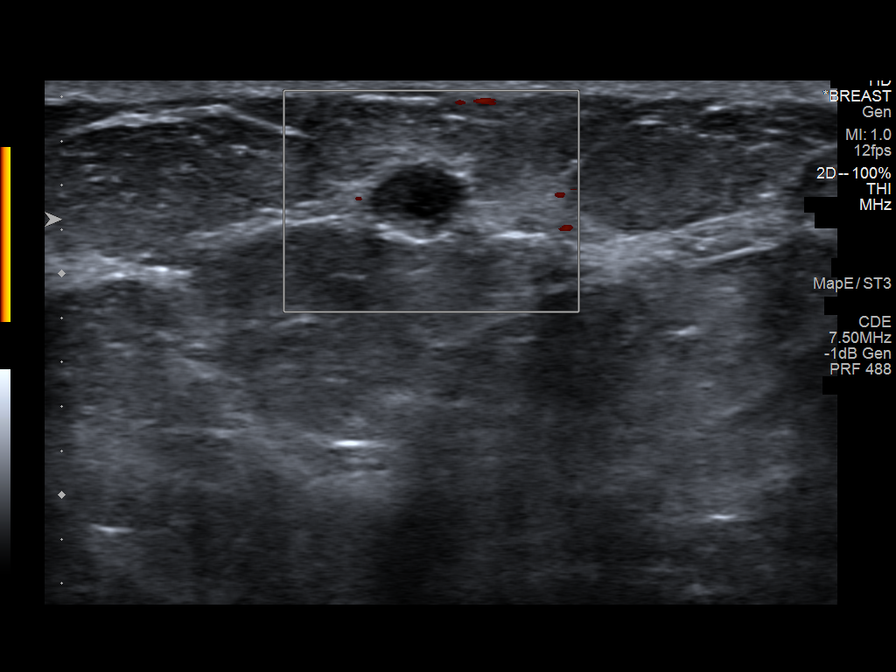
[im 5/7]
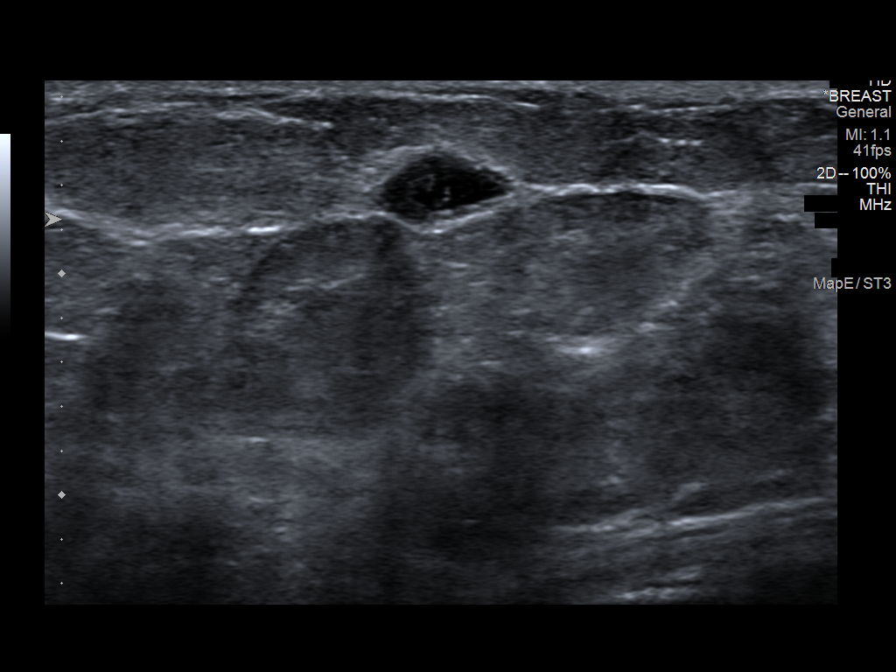
[im 6/7]
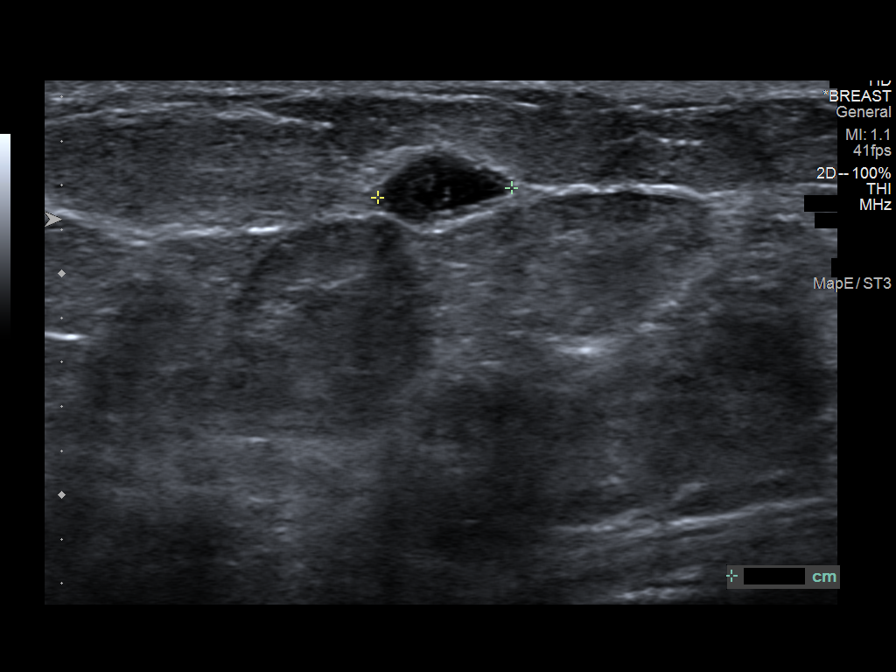
[im 7/7]
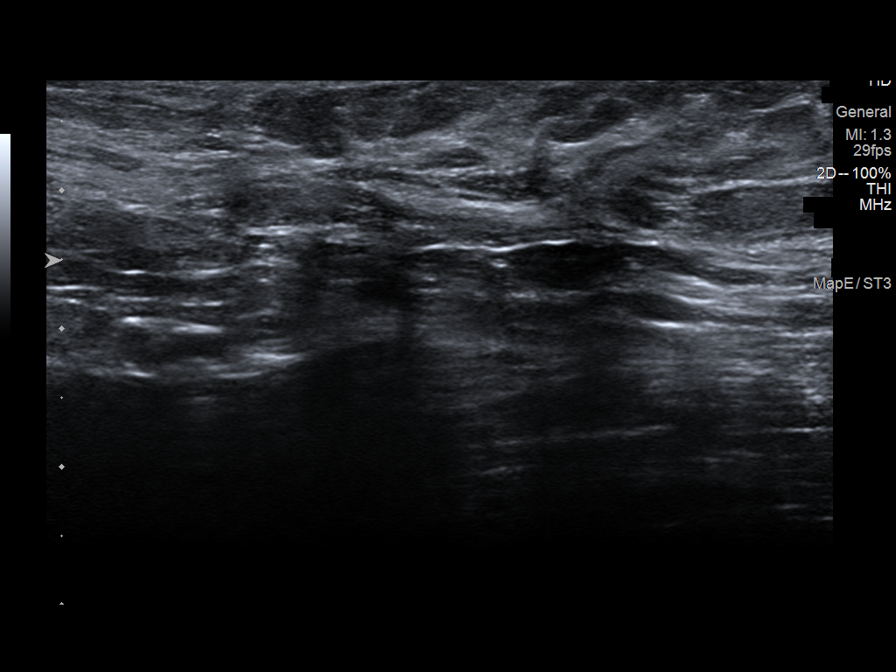

[7 of 7 positions shown; findings below may reference images not displayed]

ACR Breast Density Category b: There are scattered areas of
fibroglandular density.
FINDINGS: On today's additional views with 3D tomosynthesis, there is a
persistent asymmetry within the lower inner quadrant of the right
breast, most conspicuous on tomosynthesis MLO slice 31.

Mammographic images were processed with CAD.

Targeted ultrasound is performed, showing a complex cystic and solid
mass within the right breast at the 3 o'clock axis, 3 cm from the
nipple, measuring 6 x 3 x 4 mm, avascular, a probable correlate for
the mammographic finding.

Right axilla was evaluated with ultrasound showing no enlarged or
morphologically abnormal lymph nodes.
IMPRESSION: Complex cystic and solid mass within the right breast at the 3
o'clock axis, 3 cm from the nipple, measuring 6 x 3 x 4 mm, a
probable correlate for the mammographic asymmetry. This is a
suspicious finding for which ultrasound-guided biopsy is
recommended. Differential includes complicated cyst. If this mass
corresponds to the mammographic asymmetry, based on a postprocedure
film, location may be closer to 4 o'clock axis.

RECOMMENDATION:
1. Ultrasound-guided biopsy for the complex cystic and solid mass
within the right breast at the 3 o'clock axis.

2. Postprocedure mammogram to ensure mammographic and sonographic
correspondence. If findings do not correspond, additional
stereotactic biopsy of the mammographic finding is recommended.

Ultrasound-guided biopsy is scheduled for [REDACTED].

I have discussed the findings and recommendations with the patient.
Results were also provided in writing at the conclusion of the
visit. If applicable, a reminder letter will be sent to the patient
regarding the next appointment.

BI-RADS CATEGORY  4: Suspicious.

## 2018-02-04 DIAGNOSIS — H5210 Myopia, unspecified eye: Secondary | ICD-10-CM | POA: Diagnosis not present

## 2018-02-04 DIAGNOSIS — H5213 Myopia, bilateral: Secondary | ICD-10-CM | POA: Diagnosis not present

## 2018-02-04 DIAGNOSIS — H5203 Hypermetropia, bilateral: Secondary | ICD-10-CM | POA: Diagnosis not present

## 2018-02-04 DIAGNOSIS — H524 Presbyopia: Secondary | ICD-10-CM | POA: Diagnosis not present

## 2018-02-04 DIAGNOSIS — H52229 Regular astigmatism, unspecified eye: Secondary | ICD-10-CM | POA: Diagnosis not present

## 2018-02-10 ENCOUNTER — Other Ambulatory Visit: Payer: Self-pay | Admitting: Surgery

## 2018-02-10 DIAGNOSIS — Z9889 Other specified postprocedural states: Secondary | ICD-10-CM

## 2018-02-21 DIAGNOSIS — H401111 Primary open-angle glaucoma, right eye, mild stage: Secondary | ICD-10-CM | POA: Diagnosis not present

## 2018-02-21 DIAGNOSIS — H40042 Steroid responder, left eye: Secondary | ICD-10-CM | POA: Diagnosis not present

## 2018-02-21 DIAGNOSIS — H4089 Other specified glaucoma: Secondary | ICD-10-CM | POA: Diagnosis not present

## 2018-02-23 IMAGING — MG BREAST SURGICAL SPECIMEN
1 series · 1 of 1 positions shown · non-contrast
Comparison: Previous exam(s).

CLINICAL DATA: Status post surgical excision of a right breast
lesion following radioactive seed localization.

EXAM:
SPECIMEN RADIOGRAPH OF THE RIGHT BREAST

[R]
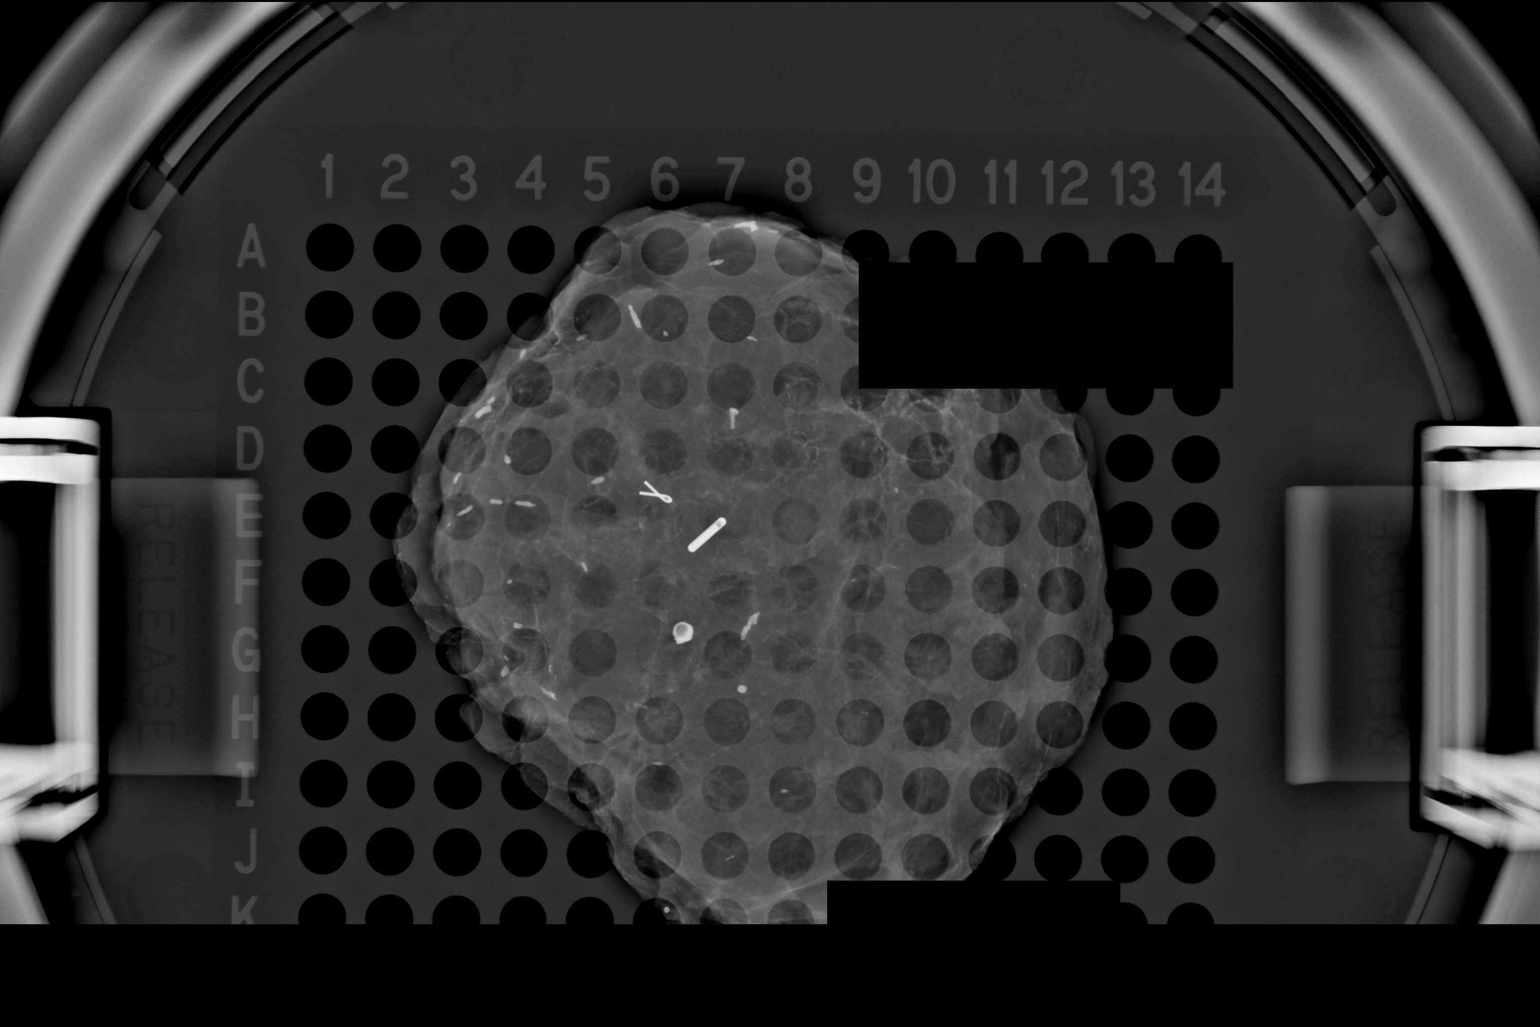

[1 of 1 positions shown; findings below may reference images not displayed]

FINDINGS: Status post excision of the right breast. The radioactive seed and
biopsy marker clip are present, completely intact, and were marked
for pathology.
IMPRESSION: Specimen radiograph of the right breast.

## 2018-03-02 DIAGNOSIS — R69 Illness, unspecified: Secondary | ICD-10-CM | POA: Diagnosis not present

## 2018-03-24 ENCOUNTER — Other Ambulatory Visit: Payer: Self-pay

## 2018-03-24 ENCOUNTER — Ambulatory Visit
Admission: RE | Admit: 2018-03-24 | Discharge: 2018-03-24 | Disposition: A | Discharge status: Home / Self Care | Payer: Medicare HMO | Source: Ambulatory Visit | Attending: Surgery | Admitting: Surgery

## 2018-03-24 DIAGNOSIS — R928 Other abnormal and inconclusive findings on diagnostic imaging of breast: Secondary | ICD-10-CM | POA: Diagnosis not present

## 2018-03-24 DIAGNOSIS — Z9889 Other specified postprocedural states: Secondary | ICD-10-CM

## 2018-03-24 HISTORY — DX: Personal history of irradiation: Z92.3

## 2018-04-18 DIAGNOSIS — R69 Illness, unspecified: Secondary | ICD-10-CM | POA: Diagnosis not present

## 2018-04-27 DIAGNOSIS — R69 Illness, unspecified: Secondary | ICD-10-CM | POA: Diagnosis not present

## 2018-06-08 DIAGNOSIS — E1140 Type 2 diabetes mellitus with diabetic neuropathy, unspecified: Secondary | ICD-10-CM | POA: Diagnosis not present

## 2018-06-08 DIAGNOSIS — E1165 Type 2 diabetes mellitus with hyperglycemia: Secondary | ICD-10-CM | POA: Diagnosis not present

## 2018-06-10 DIAGNOSIS — E1140 Type 2 diabetes mellitus with diabetic neuropathy, unspecified: Secondary | ICD-10-CM | POA: Diagnosis not present

## 2018-06-10 DIAGNOSIS — E1165 Type 2 diabetes mellitus with hyperglycemia: Secondary | ICD-10-CM | POA: Diagnosis not present

## 2018-06-13 DIAGNOSIS — R69 Illness, unspecified: Secondary | ICD-10-CM | POA: Diagnosis not present

## 2018-06-15 DIAGNOSIS — E1140 Type 2 diabetes mellitus with diabetic neuropathy, unspecified: Secondary | ICD-10-CM | POA: Diagnosis not present

## 2018-06-15 DIAGNOSIS — E1165 Type 2 diabetes mellitus with hyperglycemia: Secondary | ICD-10-CM | POA: Diagnosis not present

## 2018-07-18 DIAGNOSIS — R69 Illness, unspecified: Secondary | ICD-10-CM | POA: Diagnosis not present

## 2018-07-27 DIAGNOSIS — R69 Illness, unspecified: Secondary | ICD-10-CM | POA: Diagnosis not present

## 2018-08-22 DIAGNOSIS — R69 Illness, unspecified: Secondary | ICD-10-CM | POA: Diagnosis not present

## 2018-08-29 DIAGNOSIS — E1140 Type 2 diabetes mellitus with diabetic neuropathy, unspecified: Secondary | ICD-10-CM | POA: Diagnosis not present

## 2018-08-29 DIAGNOSIS — R5383 Other fatigue: Secondary | ICD-10-CM | POA: Diagnosis not present

## 2018-08-29 DIAGNOSIS — I10 Essential (primary) hypertension: Secondary | ICD-10-CM | POA: Diagnosis not present

## 2018-08-29 DIAGNOSIS — Z0000 Encounter for general adult medical examination without abnormal findings: Secondary | ICD-10-CM | POA: Diagnosis not present

## 2018-08-29 DIAGNOSIS — M8580 Other specified disorders of bone density and structure, unspecified site: Secondary | ICD-10-CM | POA: Diagnosis not present

## 2018-08-29 DIAGNOSIS — M899 Disorder of bone, unspecified: Secondary | ICD-10-CM | POA: Diagnosis not present

## 2018-08-29 DIAGNOSIS — Z1231 Encounter for screening mammogram for malignant neoplasm of breast: Secondary | ICD-10-CM | POA: Diagnosis not present

## 2018-08-29 DIAGNOSIS — E7800 Pure hypercholesterolemia, unspecified: Secondary | ICD-10-CM | POA: Diagnosis not present

## 2018-09-25 DIAGNOSIS — R69 Illness, unspecified: Secondary | ICD-10-CM | POA: Diagnosis not present

## 2018-10-03 DIAGNOSIS — H2511 Age-related nuclear cataract, right eye: Secondary | ICD-10-CM | POA: Diagnosis not present

## 2018-10-03 DIAGNOSIS — H40042 Steroid responder, left eye: Secondary | ICD-10-CM | POA: Diagnosis not present

## 2018-10-03 DIAGNOSIS — H4089 Other specified glaucoma: Secondary | ICD-10-CM | POA: Diagnosis not present

## 2018-10-03 DIAGNOSIS — H401111 Primary open-angle glaucoma, right eye, mild stage: Secondary | ICD-10-CM | POA: Diagnosis not present

## 2018-10-03 DIAGNOSIS — H1851 Endothelial corneal dystrophy: Secondary | ICD-10-CM | POA: Diagnosis not present

## 2018-10-03 DIAGNOSIS — H18232 Secondary corneal edema, left eye: Secondary | ICD-10-CM | POA: Diagnosis not present

## 2018-10-03 DIAGNOSIS — E113291 Type 2 diabetes mellitus with mild nonproliferative diabetic retinopathy without macular edema, right eye: Secondary | ICD-10-CM | POA: Diagnosis not present

## 2018-10-11 DIAGNOSIS — E113391 Type 2 diabetes mellitus with moderate nonproliferative diabetic retinopathy without macular edema, right eye: Secondary | ICD-10-CM | POA: Diagnosis not present

## 2018-10-11 DIAGNOSIS — H35033 Hypertensive retinopathy, bilateral: Secondary | ICD-10-CM | POA: Diagnosis not present

## 2018-10-11 DIAGNOSIS — H3563 Retinal hemorrhage, bilateral: Secondary | ICD-10-CM | POA: Diagnosis not present

## 2018-10-11 DIAGNOSIS — E113292 Type 2 diabetes mellitus with mild nonproliferative diabetic retinopathy without macular edema, left eye: Secondary | ICD-10-CM | POA: Diagnosis not present

## 2018-10-21 DIAGNOSIS — H18519 Endothelial corneal dystrophy, unspecified eye: Secondary | ICD-10-CM | POA: Diagnosis not present

## 2018-10-21 DIAGNOSIS — E113291 Type 2 diabetes mellitus with mild nonproliferative diabetic retinopathy without macular edema, right eye: Secondary | ICD-10-CM | POA: Diagnosis not present

## 2018-10-21 DIAGNOSIS — H401111 Primary open-angle glaucoma, right eye, mild stage: Secondary | ICD-10-CM | POA: Diagnosis not present

## 2018-10-26 DIAGNOSIS — R69 Illness, unspecified: Secondary | ICD-10-CM | POA: Diagnosis not present

## 2018-11-03 DIAGNOSIS — I10 Essential (primary) hypertension: Secondary | ICD-10-CM | POA: Diagnosis not present

## 2018-11-03 DIAGNOSIS — H409 Unspecified glaucoma: Secondary | ICD-10-CM | POA: Diagnosis not present

## 2018-11-03 DIAGNOSIS — Z833 Family history of diabetes mellitus: Secondary | ICD-10-CM | POA: Diagnosis not present

## 2018-11-03 DIAGNOSIS — Z791 Long term (current) use of non-steroidal anti-inflammatories (NSAID): Secondary | ICD-10-CM | POA: Diagnosis not present

## 2018-11-03 DIAGNOSIS — Z809 Family history of malignant neoplasm, unspecified: Secondary | ICD-10-CM | POA: Diagnosis not present

## 2018-11-03 DIAGNOSIS — R69 Illness, unspecified: Secondary | ICD-10-CM | POA: Diagnosis not present

## 2018-11-03 DIAGNOSIS — Z7982 Long term (current) use of aspirin: Secondary | ICD-10-CM | POA: Diagnosis not present

## 2018-11-03 DIAGNOSIS — E669 Obesity, unspecified: Secondary | ICD-10-CM | POA: Diagnosis not present

## 2018-11-03 DIAGNOSIS — E785 Hyperlipidemia, unspecified: Secondary | ICD-10-CM | POA: Diagnosis not present

## 2018-11-03 DIAGNOSIS — Z794 Long term (current) use of insulin: Secondary | ICD-10-CM | POA: Diagnosis not present

## 2018-11-03 DIAGNOSIS — E1142 Type 2 diabetes mellitus with diabetic polyneuropathy: Secondary | ICD-10-CM | POA: Diagnosis not present

## 2018-11-28 DIAGNOSIS — R69 Illness, unspecified: Secondary | ICD-10-CM | POA: Diagnosis not present

## 2018-12-07 DIAGNOSIS — E1165 Type 2 diabetes mellitus with hyperglycemia: Secondary | ICD-10-CM | POA: Diagnosis not present

## 2018-12-07 DIAGNOSIS — E1140 Type 2 diabetes mellitus with diabetic neuropathy, unspecified: Secondary | ICD-10-CM | POA: Diagnosis not present

## 2018-12-12 DIAGNOSIS — E1165 Type 2 diabetes mellitus with hyperglycemia: Secondary | ICD-10-CM | POA: Diagnosis not present

## 2018-12-12 DIAGNOSIS — E1140 Type 2 diabetes mellitus with diabetic neuropathy, unspecified: Secondary | ICD-10-CM | POA: Diagnosis not present

## 2018-12-31 DIAGNOSIS — R69 Illness, unspecified: Secondary | ICD-10-CM | POA: Diagnosis not present

## 2019-02-03 DIAGNOSIS — R69 Illness, unspecified: Secondary | ICD-10-CM | POA: Diagnosis not present

## 2019-02-13 DIAGNOSIS — R69 Illness, unspecified: Secondary | ICD-10-CM | POA: Diagnosis not present

## 2019-02-16 DIAGNOSIS — E7800 Pure hypercholesterolemia, unspecified: Secondary | ICD-10-CM | POA: Diagnosis not present

## 2019-02-16 DIAGNOSIS — I10 Essential (primary) hypertension: Secondary | ICD-10-CM | POA: Diagnosis not present

## 2019-02-16 DIAGNOSIS — G620 Drug-induced polyneuropathy: Secondary | ICD-10-CM | POA: Diagnosis not present

## 2019-02-16 DIAGNOSIS — R7989 Other specified abnormal findings of blood chemistry: Secondary | ICD-10-CM | POA: Diagnosis not present

## 2019-02-16 DIAGNOSIS — E1140 Type 2 diabetes mellitus with diabetic neuropathy, unspecified: Secondary | ICD-10-CM | POA: Diagnosis not present

## 2019-02-17 ENCOUNTER — Other Ambulatory Visit: Payer: Self-pay | Admitting: Unknown Physician Specialty

## 2019-02-17 ENCOUNTER — Other Ambulatory Visit: Payer: Self-pay | Admitting: Surgery

## 2019-02-17 DIAGNOSIS — Z9889 Other specified postprocedural states: Secondary | ICD-10-CM

## 2019-03-14 DIAGNOSIS — D473 Essential (hemorrhagic) thrombocythemia: Secondary | ICD-10-CM | POA: Diagnosis not present

## 2019-03-28 ENCOUNTER — Other Ambulatory Visit: Payer: Self-pay

## 2019-03-28 ENCOUNTER — Ambulatory Visit
Admission: RE | Admit: 2019-03-28 | Discharge: 2019-03-28 | Disposition: A | Discharge status: Home / Self Care | Payer: Medicare HMO | Source: Ambulatory Visit | Attending: Unknown Physician Specialty | Admitting: Unknown Physician Specialty

## 2019-03-28 DIAGNOSIS — Z9889 Other specified postprocedural states: Secondary | ICD-10-CM

## 2019-03-28 DIAGNOSIS — R928 Other abnormal and inconclusive findings on diagnostic imaging of breast: Secondary | ICD-10-CM | POA: Diagnosis not present

## 2019-03-28 DIAGNOSIS — Z853 Personal history of malignant neoplasm of breast: Secondary | ICD-10-CM | POA: Diagnosis not present

## 2019-03-28 HISTORY — DX: Personal history of antineoplastic chemotherapy: Z92.21

## 2019-04-03 DIAGNOSIS — H1820 Unspecified corneal edema: Secondary | ICD-10-CM | POA: Diagnosis not present

## 2019-04-03 DIAGNOSIS — H40042 Steroid responder, left eye: Secondary | ICD-10-CM | POA: Diagnosis not present

## 2019-04-03 DIAGNOSIS — H2511 Age-related nuclear cataract, right eye: Secondary | ICD-10-CM | POA: Diagnosis not present

## 2019-04-03 DIAGNOSIS — H18519 Endothelial corneal dystrophy, unspecified eye: Secondary | ICD-10-CM | POA: Diagnosis not present

## 2019-04-03 DIAGNOSIS — H4089 Other specified glaucoma: Secondary | ICD-10-CM | POA: Diagnosis not present

## 2019-04-11 DIAGNOSIS — H2511 Age-related nuclear cataract, right eye: Secondary | ICD-10-CM | POA: Diagnosis not present

## 2019-04-11 DIAGNOSIS — E113391 Type 2 diabetes mellitus with moderate nonproliferative diabetic retinopathy without macular edema, right eye: Secondary | ICD-10-CM | POA: Diagnosis not present

## 2019-04-11 DIAGNOSIS — H35033 Hypertensive retinopathy, bilateral: Secondary | ICD-10-CM | POA: Diagnosis not present

## 2019-04-11 DIAGNOSIS — E113292 Type 2 diabetes mellitus with mild nonproliferative diabetic retinopathy without macular edema, left eye: Secondary | ICD-10-CM | POA: Diagnosis not present

## 2019-04-17 DIAGNOSIS — R69 Illness, unspecified: Secondary | ICD-10-CM | POA: Diagnosis not present

## 2019-05-29 DIAGNOSIS — H4089 Other specified glaucoma: Secondary | ICD-10-CM | POA: Diagnosis not present

## 2019-05-29 DIAGNOSIS — H40042 Steroid responder, left eye: Secondary | ICD-10-CM | POA: Diagnosis not present

## 2019-05-29 DIAGNOSIS — H1820 Unspecified corneal edema: Secondary | ICD-10-CM | POA: Diagnosis not present

## 2019-05-29 DIAGNOSIS — H18519 Endothelial corneal dystrophy, unspecified eye: Secondary | ICD-10-CM | POA: Diagnosis not present

## 2019-06-06 DIAGNOSIS — R69 Illness, unspecified: Secondary | ICD-10-CM | POA: Diagnosis not present

## 2019-06-13 DIAGNOSIS — E1165 Type 2 diabetes mellitus with hyperglycemia: Secondary | ICD-10-CM | POA: Diagnosis not present

## 2019-06-13 DIAGNOSIS — E1140 Type 2 diabetes mellitus with diabetic neuropathy, unspecified: Secondary | ICD-10-CM | POA: Diagnosis not present

## 2019-07-10 DIAGNOSIS — R69 Illness, unspecified: Secondary | ICD-10-CM | POA: Diagnosis not present

## 2019-07-24 DIAGNOSIS — E1142 Type 2 diabetes mellitus with diabetic polyneuropathy: Secondary | ICD-10-CM | POA: Diagnosis not present

## 2019-07-24 DIAGNOSIS — R32 Unspecified urinary incontinence: Secondary | ICD-10-CM | POA: Diagnosis not present

## 2019-07-24 DIAGNOSIS — E669 Obesity, unspecified: Secondary | ICD-10-CM | POA: Diagnosis not present

## 2019-07-24 DIAGNOSIS — Z6836 Body mass index (BMI) 36.0-36.9, adult: Secondary | ICD-10-CM | POA: Diagnosis not present

## 2019-07-24 DIAGNOSIS — M1990 Unspecified osteoarthritis, unspecified site: Secondary | ICD-10-CM | POA: Diagnosis not present

## 2019-07-24 DIAGNOSIS — H409 Unspecified glaucoma: Secondary | ICD-10-CM | POA: Diagnosis not present

## 2019-07-24 DIAGNOSIS — E785 Hyperlipidemia, unspecified: Secondary | ICD-10-CM | POA: Diagnosis not present

## 2019-07-24 DIAGNOSIS — E1143 Type 2 diabetes mellitus with diabetic autonomic (poly)neuropathy: Secondary | ICD-10-CM | POA: Diagnosis not present

## 2019-07-24 DIAGNOSIS — Z794 Long term (current) use of insulin: Secondary | ICD-10-CM | POA: Diagnosis not present

## 2019-07-24 DIAGNOSIS — I10 Essential (primary) hypertension: Secondary | ICD-10-CM | POA: Diagnosis not present

## 2019-08-11 DIAGNOSIS — R69 Illness, unspecified: Secondary | ICD-10-CM | POA: Diagnosis not present

## 2019-08-24 DIAGNOSIS — R5383 Other fatigue: Secondary | ICD-10-CM | POA: Diagnosis not present

## 2019-08-24 DIAGNOSIS — I10 Essential (primary) hypertension: Secondary | ICD-10-CM | POA: Diagnosis not present

## 2019-08-24 DIAGNOSIS — E7800 Pure hypercholesterolemia, unspecified: Secondary | ICD-10-CM | POA: Diagnosis not present

## 2019-08-24 DIAGNOSIS — Z01419 Encounter for gynecological examination (general) (routine) without abnormal findings: Secondary | ICD-10-CM | POA: Diagnosis not present

## 2019-08-24 DIAGNOSIS — R7989 Other specified abnormal findings of blood chemistry: Secondary | ICD-10-CM | POA: Diagnosis not present

## 2019-08-24 DIAGNOSIS — M899 Disorder of bone, unspecified: Secondary | ICD-10-CM | POA: Diagnosis not present

## 2019-08-24 DIAGNOSIS — G629 Polyneuropathy, unspecified: Secondary | ICD-10-CM | POA: Diagnosis not present

## 2019-08-24 DIAGNOSIS — Z0000 Encounter for general adult medical examination without abnormal findings: Secondary | ICD-10-CM | POA: Diagnosis not present

## 2019-08-24 DIAGNOSIS — M8580 Other specified disorders of bone density and structure, unspecified site: Secondary | ICD-10-CM | POA: Diagnosis not present

## 2019-08-30 ENCOUNTER — Other Ambulatory Visit: Payer: Self-pay | Admitting: Unknown Physician Specialty

## 2019-08-30 DIAGNOSIS — R5381 Other malaise: Secondary | ICD-10-CM

## 2019-09-04 ENCOUNTER — Other Ambulatory Visit: Payer: Self-pay | Admitting: Unknown Physician Specialty

## 2019-09-04 DIAGNOSIS — Z13820 Encounter for screening for osteoporosis: Secondary | ICD-10-CM

## 2019-09-15 DIAGNOSIS — H2511 Age-related nuclear cataract, right eye: Secondary | ICD-10-CM | POA: Diagnosis not present

## 2019-09-15 DIAGNOSIS — H18519 Endothelial corneal dystrophy, unspecified eye: Secondary | ICD-10-CM | POA: Diagnosis not present

## 2019-09-15 DIAGNOSIS — H4089 Other specified glaucoma: Secondary | ICD-10-CM | POA: Diagnosis not present

## 2019-09-15 DIAGNOSIS — H1820 Unspecified corneal edema: Secondary | ICD-10-CM | POA: Diagnosis not present

## 2019-09-15 DIAGNOSIS — H40042 Steroid responder, left eye: Secondary | ICD-10-CM | POA: Diagnosis not present

## 2019-09-16 DIAGNOSIS — R69 Illness, unspecified: Secondary | ICD-10-CM | POA: Diagnosis not present

## 2019-09-27 DIAGNOSIS — Z03818 Encounter for observation for suspected exposure to other biological agents ruled out: Secondary | ICD-10-CM | POA: Diagnosis not present

## 2019-10-10 DIAGNOSIS — H2511 Age-related nuclear cataract, right eye: Secondary | ICD-10-CM | POA: Diagnosis not present

## 2019-10-10 DIAGNOSIS — E113391 Type 2 diabetes mellitus with moderate nonproliferative diabetic retinopathy without macular edema, right eye: Secondary | ICD-10-CM | POA: Diagnosis not present

## 2019-10-10 DIAGNOSIS — E113292 Type 2 diabetes mellitus with mild nonproliferative diabetic retinopathy without macular edema, left eye: Secondary | ICD-10-CM | POA: Diagnosis not present

## 2019-10-10 DIAGNOSIS — H35033 Hypertensive retinopathy, bilateral: Secondary | ICD-10-CM | POA: Diagnosis not present

## 2019-10-12 DIAGNOSIS — R69 Illness, unspecified: Secondary | ICD-10-CM | POA: Diagnosis not present

## 2019-11-05 DIAGNOSIS — R69 Illness, unspecified: Secondary | ICD-10-CM | POA: Diagnosis not present

## 2019-12-15 ENCOUNTER — Other Ambulatory Visit: Payer: Self-pay

## 2019-12-15 ENCOUNTER — Ambulatory Visit
Admission: RE | Admit: 2019-12-15 | Discharge: 2019-12-15 | Disposition: A | Discharge status: Home / Self Care | Payer: Medicare HMO | Source: Ambulatory Visit | Attending: Unknown Physician Specialty | Admitting: Unknown Physician Specialty

## 2019-12-15 DIAGNOSIS — Z13820 Encounter for screening for osteoporosis: Secondary | ICD-10-CM

## 2019-12-15 DIAGNOSIS — Z780 Asymptomatic menopausal state: Secondary | ICD-10-CM | POA: Diagnosis not present

## 2019-12-20 DIAGNOSIS — E1140 Type 2 diabetes mellitus with diabetic neuropathy, unspecified: Secondary | ICD-10-CM | POA: Diagnosis not present

## 2019-12-20 DIAGNOSIS — E1165 Type 2 diabetes mellitus with hyperglycemia: Secondary | ICD-10-CM | POA: Diagnosis not present

## 2019-12-20 DIAGNOSIS — E785 Hyperlipidemia, unspecified: Secondary | ICD-10-CM | POA: Diagnosis not present

## 2020-01-01 DIAGNOSIS — R69 Illness, unspecified: Secondary | ICD-10-CM | POA: Diagnosis not present

## 2020-02-21 ENCOUNTER — Other Ambulatory Visit: Payer: Self-pay | Admitting: Unknown Physician Specialty

## 2020-02-21 DIAGNOSIS — Z1231 Encounter for screening mammogram for malignant neoplasm of breast: Secondary | ICD-10-CM

## 2020-04-11 ENCOUNTER — Other Ambulatory Visit: Payer: Self-pay

## 2020-04-11 ENCOUNTER — Ambulatory Visit
Admission: RE | Admit: 2020-04-11 | Discharge: 2020-04-11 | Disposition: A | Discharge status: Home / Self Care | Payer: Medicare HMO | Source: Ambulatory Visit | Attending: Unknown Physician Specialty | Admitting: Unknown Physician Specialty

## 2020-04-11 DIAGNOSIS — Z1231 Encounter for screening mammogram for malignant neoplasm of breast: Secondary | ICD-10-CM

## 2021-03-10 ENCOUNTER — Other Ambulatory Visit: Payer: Self-pay | Admitting: Unknown Physician Specialty

## 2021-03-10 DIAGNOSIS — Z1231 Encounter for screening mammogram for malignant neoplasm of breast: Secondary | ICD-10-CM

## 2021-04-14 ENCOUNTER — Ambulatory Visit
Admission: RE | Admit: 2021-04-14 | Discharge: 2021-04-14 | Disposition: A | Discharge status: Home / Self Care | Payer: HMO | Source: Ambulatory Visit | Attending: Unknown Physician Specialty | Admitting: Unknown Physician Specialty

## 2021-04-14 DIAGNOSIS — Z1231 Encounter for screening mammogram for malignant neoplasm of breast: Secondary | ICD-10-CM

## 2021-10-06 DIAGNOSIS — H18421 Band keratopathy, right eye: Secondary | ICD-10-CM | POA: Insufficient documentation

## 2021-12-05 ENCOUNTER — Encounter (HOSPITAL_COMMUNITY): Payer: Self-pay | Admitting: *Deleted

## 2021-12-05 ENCOUNTER — Ambulatory Visit (HOSPITAL_COMMUNITY)
Admission: EM | Admit: 2021-12-05 | Discharge: 2021-12-05 | Disposition: A | Discharge status: Home / Self Care | Payer: HMO | Attending: Family Medicine | Admitting: Family Medicine

## 2021-12-05 DIAGNOSIS — N3090 Cystitis, unspecified without hematuria: Secondary | ICD-10-CM | POA: Diagnosis not present

## 2021-12-05 DIAGNOSIS — R102 Pelvic and perineal pain: Secondary | ICD-10-CM

## 2021-12-05 LAB — POCT URINALYSIS DIPSTICK, ED / UC
Bilirubin Urine: NEGATIVE
Glucose, UA: NEGATIVE mg/dL
Hgb urine dipstick: NEGATIVE
Ketones, ur: NEGATIVE mg/dL
Nitrite: NEGATIVE
Protein, ur: 100 mg/dL — AB
Specific Gravity, Urine: 1.015 (ref 1.005–1.030)
Urobilinogen, UA: 0.2 mg/dL (ref 0.0–1.0)
pH: 7 (ref 5.0–8.0)

## 2021-12-05 MED ORDER — CEPHALEXIN 250 MG PO CAPS: 250.0000 mg | ORAL_CAPSULE | Freq: Three times a day (TID) | ORAL | 0 refills | Status: AC

## 2021-12-05 MED ORDER — ONDANSETRON 4 MG PO TBDP
4.0000 mg | ORAL_TABLET | Freq: Three times a day (TID) | ORAL | 0 refills | Status: DC | PRN
Start: 2021-12-05 — End: 2023-05-19

## 2021-12-05 NOTE — ED Triage Notes (Signed)
Pt states she is having lower abdominal pain and has no appetite and can only drink fluids since Monday. She is having some nausea, constipation. She denies any urinary sx. She only has taken 3 IBU yesterday she is unaware if it helped she went to bed after. She had a little bit of a soft BM this morning. She does note the smell was more foul than normal.

## 2021-12-05 NOTE — Discharge Instructions (Signed)
The urinalysis showed some white blood cells which can be a sign of a bladder infection.  Take cephalexin 250 mg--1 capsule 3 times daily for 7 days  Ondansetron dissolved in the mouth every 8 hours as needed for nausea or vomiting. Clear liquids and bland things to eat.  Urine culture is sent, and staff will notify you if it looks like your antibiotic should be changed.

## 2021-12-05 NOTE — ED Provider Notes (Signed)
[place] [place]    CSN: [id number] Arrival date & time: 12/05/21  3662      History   Chief Complaint Chief Complaint  Patient presents with   Abdominal Pain    HPI Alexandra Francis is a 87 y.o. female.    Abdominal Pain  Here for lower abdominal pain and nausea that is been going on since November 20.  She has had some low-grade temps.  No chills.  She has not vomited or had any diarrhea.  No known exposures.  She has had some urinary frequency and urge also  Past Medical History:  Diagnosis Date   Arthritis    Breast cancer ([place]) 1993   Right Breast Cancer   Breast cancer ([place]) 2018   Right Breast Cancer   Cancer ([place])    rt breast, uterine cancer   Complication of anesthesia    Diabetes mellitus    Hyperlipidemia    Hypertension    Personal history of chemotherapy    Personal history of radiation therapy 1993   breast   PONV (postoperative nausea and vomiting)     Patient Active Problem List   Diagnosis Date Noted   History of right breast cancer, stage 1 04/06/2011    Past Surgical History:  Procedure Laterality Date   ABDOMINAL HYSTERECTOMY     BREAST LUMPECTOMY Right 1993   BREAST LUMPECTOMY Right 2018   BREAST LUMPECTOMY WITH RADIOACTIVE SEED LOCALIZATION Right 03/13/2016   Procedure: RIGHT BREAST LUMPECTOMY WITH RADIOACTIVE SEED LOCALIZATION;  Surgeon: Alphonsa Overall, MD;  Location: [facility];  Service: General;  Laterality: Right;   BREAST SURGERY  Nov 1993   right lumpectomy    OB History   No obstetric history on file.      Home Medications    Prior to Admission medications   Medication Sig Start Date End Date Taking? Authorizing Provider  aspirin EC 81 MG tablet Take 81 mg by mouth daily.   Yes [provider]  brimonidine (ALPHAGAN) 0.2 % ophthalmic solution Place 1 drop into the left eye 2 (two) times daily. 01/21/16  Yes [provider]  cephALEXin (KEFLEX) 250 MG capsule Take 1 capsule (250 mg total) by mouth 3  (three) times daily for 7 days. 12/05/21 12/12/21 Yes Barrett Henle, MD  cholecalciferol (VITAMIN D) 1000 units tablet Take 1,000 Units by mouth daily.   Yes [provider]  Flaxseed, Linseed, (FLAXSEED OIL) 1000 MG CAPS Take 1,000 mg by mouth daily.   Yes [provider]  gabapentin (NEURONTIN) 600 MG tablet Take 600 mg by mouth at bedtime. 02/04/13  Yes [provider]  GLIPIZIDE XL 10 MG 24 hr tablet Take 10 mg by mouth 2 (two) times daily. 01/28/16  Yes [provider]  ibuprofen (ADVIL,MOTRIN) 200 MG tablet Take 400 mg by mouth every 8 (eight) hours as needed for pain.   Yes [provider]  insulin NPH Human (HUMULIN N,NOVOLIN N) 100 UNIT/ML injection Inject 20-30 Units into the skin 2 (two) times daily. 30 units in the morning & 20 units in the evening 01/02/13  Yes [provider]  losartan-hydrochlorothiazide (HYZAAR) 100-12.5 MG per tablet Take 1 tablet by mouth daily.  02/27/11  Yes [provider]  Multiple Vitamin (MULTIVITAMIN) tablet Take 0.5 tablets by mouth 2 (two) times daily.   Yes [provider]  Omega-3 Fatty Acids (FISH OIL) 1000 MG CAPS Take 1,000 mg by mouth daily.   Yes [provider]  ondansetron (  ZOFRAN-ODT) 4 MG disintegrating tablet Take 1 tablet (4 mg total) by mouth every 8 (eight) hours as needed for nausea or vomiting. 12/05/21  Yes Barrett Henle, MD  simvastatin (ZOCOR) 20 MG tablet Take 20 mg by mouth at bedtime. 01/28/16  Yes [provider]  timolol (TIMOPTIC) 0.5 % ophthalmic solution Place 1 drop into both eyes 2 (two) times daily. 01/24/16  Yes [provider]  VITAMIN E PO Take 500 Units by mouth daily.   Yes [provider]  sitaGLIPtin (JANUVIA) 100 MG tablet Take 100 mg by mouth daily.    [provider]    Family History Family History  Problem Relation Age of Onset   Cancer Father        unaware of origin   Cancer Brother         kidney    Social History Social History   Tobacco Use   Smoking status: Never   Smokeless tobacco: Never  Vaping Use   Vaping Use: Never used  Substance Use Topics   Alcohol use: No   Drug use: No     Allergies   Pioglitazone, Codeine, Metformin, Other, and Sulfa antibiotics   Review of Systems Review of Systems  Gastrointestinal:  Positive for abdominal pain.     Physical Exam Triage Vital Signs ED Triage Vitals  Enc Vitals Group     BP 12/05/21 1017 (!) 140/77     Pulse Rate 12/05/21 1017 72     Resp 12/05/21 1017 18     Temp 12/05/21 1017 98.6 F (37 C)     Temp Source 12/05/21 1017 Oral     SpO2 12/05/21 1017 97 %     Weight --      Height --      Head Circumference --      Peak Flow --      Pain Score 12/05/21 1012 10     Pain Loc --      Pain Edu? --      Excl. in [place]? --    No data found.  Updated Vital Signs BP (!) 140/77 (BP Location: Left Arm)   Pulse 72   Temp 98.6 F (37 C) (Oral)   Resp 18   SpO2 97%   Visual Acuity Right Eye Distance:   Left Eye Distance:   Bilateral Distance:    Right Eye Near:   Left Eye Near:    Bilateral Near:     Physical Exam Vitals reviewed.  Constitutional:      General: She is not in acute distress.    Appearance: She is not ill-appearing, toxic-appearing or diaphoretic.  HENT:     Mouth/Throat:     Mouth: Mucous membranes are moist.  Cardiovascular:     Rate and Rhythm: Normal rate and regular rhythm.     Heart sounds: No murmur heard. Pulmonary:     Effort: Pulmonary effort is normal.     Breath sounds: Normal breath sounds.  Abdominal:     Palpations: Abdomen is soft.     Tenderness: There is no abdominal tenderness.  Musculoskeletal:     Cervical back: Neck supple.  Lymphadenopathy:     Cervical: No cervical adenopathy.  Skin:    Coloration: Skin is not jaundiced or pale.  Neurological:     General: No focal deficit present.     Mental Status: She is alert and oriented to person,  place, and time.  Psychiatric:  Behavior: Behavior normal.      UC Treatments / Results  Labs (all labs ordered are listed, but only abnormal results are displayed) Labs Reviewed  POCT URINALYSIS DIPSTICK, ED / UC - Abnormal; Notable for the following components:      Result Value   Protein, ur 100 (*)    Leukocytes,Ua TRACE (*)    All other components within normal limits  URINE CULTURE    EKG   Radiology No results found.  Procedures Procedures (including critical care time)  Medications Ordered in UC Medications - No data to display  Initial Impression / Assessment and Plan / UC Course  I have reviewed the triage vital signs and the nursing notes.  Pertinent labs & imaging results that were available during my care of the patient were reviewed by me and considered in my medical decision making (see chart for details).        UA shows some leukocytes and a little protein.  I am going to treat for cystitis.  I did discuss with the patient and her daughter that this still may be more of a gastroenteritis, but with the urinary symptoms elicited I think it best to treat with an antibiotic Final Clinical Impressions(s) / UC Diagnoses   Final diagnoses:  Cystitis  Pelvic pain in female     Discharge Instructions      The urinalysis showed some white blood cells which can be a sign of a bladder infection.  Take cephalexin 250 mg--1 capsule 3 times daily for 7 days  Ondansetron dissolved in the mouth every 8 hours as needed for nausea or vomiting. Clear liquids and bland things to eat.  Urine culture is sent, and staff will notify you if it looks like your antibiotic should be changed.      ED Prescriptions     Medication Sig Dispense Auth. Provider   ondansetron (ZOFRAN-ODT) 4 MG disintegrating tablet Take 1 tablet (4 mg total) by mouth every 8 (eight) hours as needed for nausea or vomiting. 10 tablet Barrett Henle, MD   cephALEXin (KEFLEX)  250 MG capsule Take 1 capsule (250 mg total) by mouth 3 (three) times daily for 7 days. 21 capsule Barrett Henle, MD      PDMP not reviewed this encounter.   Barrett Henle, MD 12/05/21 [id number]

## 2021-12-06 LAB — URINE CULTURE: Culture: <10000 — AB

## 2022-01-19 DIAGNOSIS — G629 Polyneuropathy, unspecified: Secondary | ICD-10-CM | POA: Diagnosis not present

## 2022-01-19 DIAGNOSIS — M25561 Pain in right knee: Secondary | ICD-10-CM | POA: Diagnosis not present

## 2022-01-19 DIAGNOSIS — E119 Type 2 diabetes mellitus without complications: Secondary | ICD-10-CM | POA: Diagnosis not present

## 2022-01-19 DIAGNOSIS — I10 Essential (primary) hypertension: Secondary | ICD-10-CM | POA: Diagnosis not present

## 2022-01-29 ENCOUNTER — Ambulatory Visit (INDEPENDENT_AMBULATORY_CARE_PROVIDER_SITE_OTHER): Payer: PPO | Admitting: Sports Medicine

## 2022-01-29 ENCOUNTER — Ambulatory Visit (INDEPENDENT_AMBULATORY_CARE_PROVIDER_SITE_OTHER): Payer: PPO

## 2022-01-29 DIAGNOSIS — M1711 Unilateral primary osteoarthritis, right knee: Secondary | ICD-10-CM | POA: Diagnosis not present

## 2022-01-29 DIAGNOSIS — Z09 Encounter for follow-up examination after completed treatment for conditions other than malignant neoplasm: Secondary | ICD-10-CM | POA: Diagnosis not present

## 2022-01-29 DIAGNOSIS — M19012 Primary osteoarthritis, left shoulder: Secondary | ICD-10-CM | POA: Insufficient documentation

## 2022-01-29 DIAGNOSIS — M1712 Unilateral primary osteoarthritis, left knee: Secondary | ICD-10-CM | POA: Diagnosis not present

## 2022-01-29 MED ORDER — CELECOXIB 200 MG PO CAPS: ORAL_CAPSULE | ORAL | 2 refills | Status: DC

## 2022-01-29 NOTE — Assessment & Plan Note (Signed)
Chronic pain with external rotation, moderate loss of motion. Suspect OA. Adding x-rays, Celebrex, formal PT, return to see me in 6 weeks, injection if not better.

## 2022-01-29 NOTE — Progress Notes (Signed)
    Procedures performed today:    None.  Independent interpretation of notes and tests performed by another provider:   None.  Brief History, Exam, Impression, and Recommendations:    Primary osteoarthritis of right knee This is a very pleasant 88 year old female, [place] history of right knee pain, increasing valgus deformity. She is tried some over-the-counter NSAIDs with minimal efficacy. On exam she has tenderness medial and lateral joint lines, occasional popping, mild valgus deformity. Suspect osteoarthritis. We will get x-rays, I added an unloader brace, adding Celebrex, formal physical therapy, return to see me in 6 weeks, injection if not better.  Primary osteoarthritis of left shoulder Chronic pain with external rotation, moderate loss of motion. Suspect OA. Adding x-rays, Celebrex, formal PT, return to see me in 6 weeks, injection if not better.    ____________________________________________ Gwen Her. Dianah Field, M.D., ABFM., CAQSM., AME. Primary Care and Sports Medicine [facility] [facility]  Adjunct Professor of [place] of [facility] of [employer]  Risk manager

## 2022-01-29 NOTE — Assessment & Plan Note (Signed)
This is a very pleasant 88 year old female, [place] history of right knee pain, increasing valgus deformity. She is tried some over-the-counter NSAIDs with minimal efficacy. On exam she has tenderness medial and lateral joint lines, occasional popping, mild valgus deformity. Suspect osteoarthritis. We will get x-rays, I added an unloader brace, adding Celebrex, formal physical therapy, return to see me in 6 weeks, injection if not better.

## 2022-02-09 DIAGNOSIS — M25561 Pain in right knee: Secondary | ICD-10-CM | POA: Diagnosis not present

## 2022-02-09 DIAGNOSIS — M25512 Pain in left shoulder: Secondary | ICD-10-CM | POA: Diagnosis not present

## 2022-02-12 DIAGNOSIS — M25512 Pain in left shoulder: Secondary | ICD-10-CM | POA: Diagnosis not present

## 2022-02-12 DIAGNOSIS — M25561 Pain in right knee: Secondary | ICD-10-CM | POA: Diagnosis not present

## 2022-02-13 DIAGNOSIS — H401131 Primary open-angle glaucoma, bilateral, mild stage: Secondary | ICD-10-CM | POA: Diagnosis not present

## 2022-02-17 DIAGNOSIS — M25561 Pain in right knee: Secondary | ICD-10-CM | POA: Diagnosis not present

## 2022-02-17 DIAGNOSIS — M25512 Pain in left shoulder: Secondary | ICD-10-CM | POA: Diagnosis not present

## 2022-02-19 DIAGNOSIS — M25512 Pain in left shoulder: Secondary | ICD-10-CM | POA: Diagnosis not present

## 2022-02-19 DIAGNOSIS — M25561 Pain in right knee: Secondary | ICD-10-CM | POA: Diagnosis not present

## 2022-02-20 DIAGNOSIS — H2511 Age-related nuclear cataract, right eye: Secondary | ICD-10-CM | POA: Diagnosis not present

## 2022-02-20 DIAGNOSIS — Z947 Corneal transplant status: Secondary | ICD-10-CM | POA: Diagnosis not present

## 2022-02-20 DIAGNOSIS — H18511 Endothelial corneal dystrophy, right eye: Secondary | ICD-10-CM | POA: Diagnosis not present

## 2022-02-25 DIAGNOSIS — M25561 Pain in right knee: Secondary | ICD-10-CM | POA: Diagnosis not present

## 2022-02-25 DIAGNOSIS — M25512 Pain in left shoulder: Secondary | ICD-10-CM | POA: Diagnosis not present

## 2022-02-27 DIAGNOSIS — M25561 Pain in right knee: Secondary | ICD-10-CM | POA: Diagnosis not present

## 2022-02-27 DIAGNOSIS — M25512 Pain in left shoulder: Secondary | ICD-10-CM | POA: Diagnosis not present

## 2022-03-03 DIAGNOSIS — M25512 Pain in left shoulder: Secondary | ICD-10-CM | POA: Diagnosis not present

## 2022-03-03 DIAGNOSIS — M25561 Pain in right knee: Secondary | ICD-10-CM | POA: Diagnosis not present

## 2022-03-05 DIAGNOSIS — M25512 Pain in left shoulder: Secondary | ICD-10-CM | POA: Diagnosis not present

## 2022-03-05 DIAGNOSIS — M25561 Pain in right knee: Secondary | ICD-10-CM | POA: Diagnosis not present

## 2022-03-10 DIAGNOSIS — M25512 Pain in left shoulder: Secondary | ICD-10-CM | POA: Diagnosis not present

## 2022-03-10 DIAGNOSIS — M25561 Pain in right knee: Secondary | ICD-10-CM | POA: Diagnosis not present

## 2022-03-12 ENCOUNTER — Ambulatory Visit: Payer: HMO | Admitting: Sports Medicine

## 2022-03-16 ENCOUNTER — Ambulatory Visit (INDEPENDENT_AMBULATORY_CARE_PROVIDER_SITE_OTHER): Payer: PPO | Admitting: Sports Medicine

## 2022-03-16 DIAGNOSIS — M19012 Primary osteoarthritis, left shoulder: Secondary | ICD-10-CM

## 2022-03-16 DIAGNOSIS — M1711 Unilateral primary osteoarthritis, right knee: Secondary | ICD-10-CM | POA: Diagnosis not present

## 2022-03-16 NOTE — Assessment & Plan Note (Signed)
Alexandra Francis is a pleasant 88 year old female, knee osteoarthritis, we added Celebrex, formal PT, unloader brace. She has not taken the Celebrex. She is doing okay but still has some discomfort, I like her to do home physical therapy for the next 6 weeks, take the Celebrex consistently, she can return to see me after that if still significant pain.

## 2022-03-16 NOTE — Progress Notes (Signed)
    Procedures performed today:    None.  Independent interpretation of notes and tests performed by another provider:   None.  Brief History, Exam, Impression, and Recommendations:    Primary osteoarthritis of right knee Alexandra Francis is a pleasant 88 year old female, knee osteoarthritis, we added Celebrex, formal PT, unloader brace. She has not taken the Celebrex. She is doing okay but still has some discomfort, I like her to do home physical therapy for the next 6 weeks, take the Celebrex consistently, she can return to see me after that if still significant pain.  Primary osteoarthritis of left shoulder OA, needs to start celebrex. Injection if no better 6 weeks.    ____________________________________________ Gwen Her. Dianah Field, M.D., ABFM., CAQSM., AME. Primary Care and Sports Medicine [place] [facility]  Adjunct Professor of [place] of [facility] of Medicine  Risk manager

## 2022-03-16 NOTE — Assessment & Plan Note (Signed)
OA, needs to start celebrex. Injection if no better 6 weeks.

## 2022-03-25 DIAGNOSIS — Z885 Allergy status to narcotic agent status: Secondary | ICD-10-CM | POA: Diagnosis not present

## 2022-03-25 DIAGNOSIS — E1136 Type 2 diabetes mellitus with diabetic cataract: Secondary | ICD-10-CM | POA: Diagnosis not present

## 2022-03-25 DIAGNOSIS — E1169 Type 2 diabetes mellitus with other specified complication: Secondary | ICD-10-CM | POA: Diagnosis not present

## 2022-03-25 DIAGNOSIS — E785 Hyperlipidemia, unspecified: Secondary | ICD-10-CM | POA: Diagnosis not present

## 2022-03-25 DIAGNOSIS — Z79899 Other long term (current) drug therapy: Secondary | ICD-10-CM | POA: Diagnosis not present

## 2022-03-25 DIAGNOSIS — Z882 Allergy status to sulfonamides status: Secondary | ICD-10-CM | POA: Diagnosis not present

## 2022-03-25 DIAGNOSIS — Z7982 Long term (current) use of aspirin: Secondary | ICD-10-CM | POA: Diagnosis not present

## 2022-03-25 DIAGNOSIS — I10 Essential (primary) hypertension: Secondary | ICD-10-CM | POA: Diagnosis not present

## 2022-03-25 DIAGNOSIS — H2511 Age-related nuclear cataract, right eye: Secondary | ICD-10-CM | POA: Diagnosis not present

## 2022-03-25 DIAGNOSIS — Z888 Allergy status to other drugs, medicaments and biological substances status: Secondary | ICD-10-CM | POA: Diagnosis not present

## 2022-03-25 DIAGNOSIS — Z853 Personal history of malignant neoplasm of breast: Secondary | ICD-10-CM | POA: Diagnosis not present

## 2022-03-25 DIAGNOSIS — E1142 Type 2 diabetes mellitus with diabetic polyneuropathy: Secondary | ICD-10-CM | POA: Diagnosis not present

## 2022-03-25 DIAGNOSIS — H18511 Endothelial corneal dystrophy, right eye: Secondary | ICD-10-CM | POA: Diagnosis not present

## 2022-03-25 DIAGNOSIS — Z6832 Body mass index (BMI) 32.0-32.9, adult: Secondary | ICD-10-CM | POA: Diagnosis not present

## 2022-03-25 DIAGNOSIS — E669 Obesity, unspecified: Secondary | ICD-10-CM | POA: Diagnosis not present

## 2022-03-26 DIAGNOSIS — H189 Unspecified disorder of cornea: Secondary | ICD-10-CM | POA: Diagnosis not present

## 2022-04-09 ENCOUNTER — Other Ambulatory Visit: Payer: Self-pay | Admitting: Family Medicine

## 2022-04-09 DIAGNOSIS — Z1231 Encounter for screening mammogram for malignant neoplasm of breast: Secondary | ICD-10-CM

## 2022-04-27 ENCOUNTER — Ambulatory Visit (INDEPENDENT_AMBULATORY_CARE_PROVIDER_SITE_OTHER): Payer: PPO | Admitting: Sports Medicine

## 2022-04-27 DIAGNOSIS — M1711 Unilateral primary osteoarthritis, right knee: Secondary | ICD-10-CM

## 2022-04-27 DIAGNOSIS — M19012 Primary osteoarthritis, left shoulder: Secondary | ICD-10-CM

## 2022-04-27 NOTE — Assessment & Plan Note (Signed)
Doing Celebrex twice daily, osteoarthritis glenohumeral and x-rays, as below continue PT, Celebrex, we will consider injection at the 6-week follow-up if needed.

## 2022-04-27 NOTE — Assessment & Plan Note (Signed)
Doing okay so far with Celebrex, formal PT, unloader brace, I did accidentally break the unloader brace today so we changed it. Return to see me in about 6 weeks and if still having pain we will consider injection though I do not think she is having enough discomfort to actually warrant an invasive procedure.

## 2022-04-27 NOTE — Progress Notes (Signed)
    Procedures performed today:    None.  Independent interpretation of notes and tests performed by another provider:   None.  Brief History, Exam, Impression, and Recommendations:    Primary osteoarthritis of right knee Doing okay so far with Celebrex, formal PT, unloader brace, I did accidentally break the unloader brace today so we changed it. Return to see me in about 6 weeks and if still having pain we will consider injection though I do not think she is having enough discomfort to actually warrant an invasive procedure.  Primary osteoarthritis of left shoulder Doing Celebrex twice daily, osteoarthritis glenohumeral and x-rays, as below continue PT, Celebrex, we will consider injection at the 6-week follow-up if needed.    ____________________________________________ Ihor Austin. Benjamin Stain, M.D., ABFM., CAQSM., AME. Primary Care and Sports Medicine [place] [facility]  Adjunct Professor of Family Medicine  [place] [employer] [facility] [employer]  Restaurant manager, fast food

## 2022-05-26 ENCOUNTER — Ambulatory Visit
Admission: RE | Admit: 2022-05-26 | Discharge: 2022-05-26 | Disposition: A | Discharge status: Home / Self Care | Payer: PPO | Source: Ambulatory Visit | Attending: Family Medicine | Admitting: Family Medicine

## 2022-05-26 DIAGNOSIS — Z1231 Encounter for screening mammogram for malignant neoplasm of breast: Secondary | ICD-10-CM

## 2022-06-09 ENCOUNTER — Ambulatory Visit (INDEPENDENT_AMBULATORY_CARE_PROVIDER_SITE_OTHER): Payer: PPO | Admitting: Sports Medicine

## 2022-06-09 DIAGNOSIS — M19012 Primary osteoarthritis, left shoulder: Secondary | ICD-10-CM

## 2022-06-09 DIAGNOSIS — M1711 Unilateral primary osteoarthritis, right knee: Secondary | ICD-10-CM

## 2022-06-09 MED ORDER — ACETAMINOPHEN ER 650 MG PO TBCR: 650.0000 mg | EXTENDED_RELEASE_TABLET | Freq: Three times a day (TID) | ORAL | 3 refills | Status: AC | PRN

## 2022-06-09 NOTE — Assessment & Plan Note (Signed)
Alexandra Francis returns, she has done well with Celebrex and an unloader brace, it does slip down so we placed it directly against her skin, and it did not slip. She is happy with how things are going but she still does have some mild discomfort so she can supplement with arthritis strength Tylenol as needed, continue therapy and return as needed for this.

## 2022-06-09 NOTE — Progress Notes (Signed)
    Procedures performed today:    None.  Independent interpretation of notes and tests performed by another provider:   None.  Brief History, Exam, Impression, and Recommendations:    Primary osteoarthritis of right knee Alexandra Francis returns, she has done well with Celebrex and an unloader brace, it does slip down so we placed it directly against her skin, and it did not slip. She is happy with how things are going but she still does have some mild discomfort so she can supplement with arthritis strength Tylenol as needed, continue therapy and return as needed for this.  Primary osteoarthritis of left shoulder As below with the knee arthritis she has glenohumeral osteoarthritis on x-rays, continue PT, Celebrex, Tylenol as needed, no need for injection at this juncture. Return to see me as needed.    ____________________________________________ Ihor Austin. Benjamin Stain, M.D., ABFM., CAQSM., AME. Primary Care and Sports Medicine [place] [facility]  Adjunct Professor of Family Medicine  [place] [employer] [facility] [employer]  Restaurant manager, fast food

## 2022-06-09 NOTE — Assessment & Plan Note (Signed)
As below with the knee arthritis she has glenohumeral osteoarthritis on x-rays, continue PT, Celebrex, Tylenol as needed, no need for injection at this juncture. Return to see me as needed.

## 2022-07-08 ENCOUNTER — Ambulatory Visit: Payer: Self-pay | Admitting: Podiatry

## 2022-07-23 ENCOUNTER — Encounter: Payer: Self-pay | Admitting: Podiatry

## 2022-07-23 ENCOUNTER — Ambulatory Visit: Payer: PPO | Admitting: Podiatry

## 2022-07-23 DIAGNOSIS — E1140 Type 2 diabetes mellitus with diabetic neuropathy, unspecified: Secondary | ICD-10-CM

## 2022-07-23 DIAGNOSIS — L600 Ingrowing nail: Secondary | ICD-10-CM

## 2022-07-23 NOTE — Progress Notes (Signed)
  Subjective:  Patient ID: Alexandra Francis, female    DOB: 09-10-1934,   MRN: [id number]  Chief Complaint  Patient presents with   Ingrown Toenail    Left great toe sore to touch after ingrown procedure 6/19 patient states she is a diabetic     88 y.o. female presents for diabetic foot check and concern for possible ingrown nail on the right great toe. Relates she did have the nail trimmed back by her primary doctor but relates some soreness still at the tip. Relates burning and tingling in their feet. Patient is diabetic and last A1c was 7.6  PCP:  Lasandra Beech, MD    . Denies any other pedal complaints. Denies n/v/f/c.   Past Medical History:  Diagnosis Date   Arthritis    Breast cancer [facility]) 1993   Right Breast Cancer   Breast cancer (HCC) 2018   Right Breast Cancer   Cancer (HCC)    rt breast, uterine cancer   Complication of anesthesia    Diabetes mellitus    Hyperlipidemia    Hypertension    Personal history of chemotherapy    Personal history of radiation therapy 1993   breast   PONV (postoperative nausea and vomiting)     Objective:  Physical Exam: Vascular: DP/PT pulses 2/4 bilateral. CFT <3 seconds. Absent hair growth on digits. Edema noted to bilateral lower extremities. Xerosis noted bilaterally.  Skin. No lacerations or abrasions bilateral feet. Nails 1-5 bilateral  are normal in appearance. No signs of infection or incurvation on the right great toe. Some tenderness to the medial border where there is some hyperkeratotic tissue.  Musculoskeletal: MMT 5/5 bilateral lower extremities in DF, PF, Inversion and Eversion. Deceased ROM in DF of ankle joint.  Neurological: Sensation intact to light touch. Protective sensation diminished bilateral.    Assessment:   1. Type 2 diabetes mellitus with diabetic neuropathy, without [place]-term current use of insulin (HCC)   2. Ingrown right greater toenail      Plan:  Patient was evaluated and treated and all questions  answered. -Discussed and educated patient on diabetic foot care, especially with  regards to the vascular, neurological and musculoskeletal systems.  -Stressed the importance of good glycemic control and the detriment of not  controlling glucose levels in relation to the foot. -Discussed supportive shoes at all times and checking feet regularly.  -Mechanically debrided the right great toenail and the hyperkeratotic tissue as courtesy.  -Toe cap provided for comfort.  -Answered all patient questions -Patient to return  in 1 year for DM foot check.  -Patient advised to call the office if any problems or questions arise in the meantime.   Louann Sjogren, DPM

## 2022-12-16 ENCOUNTER — Encounter: Payer: Self-pay | Admitting: Sports Medicine

## 2022-12-16 ENCOUNTER — Ambulatory Visit (INDEPENDENT_AMBULATORY_CARE_PROVIDER_SITE_OTHER): Payer: PPO | Admitting: Sports Medicine

## 2022-12-16 DIAGNOSIS — M1711 Unilateral primary osteoarthritis, right knee: Secondary | ICD-10-CM

## 2022-12-16 MED ORDER — CELECOXIB 200 MG PO CAPS: ORAL_CAPSULE | ORAL | 2 refills | Status: DC

## 2022-12-16 NOTE — Assessment & Plan Note (Signed)
Alexandra Francis returns, she is a pleasant 88 year old female, historically symptoms well-controlled with Celebrex, Tylenol, unloader brace, unfortunately she stopped the Celebrex and now pain has worsened, on exam she does have some weakness to abduction of the hips and extension of the knee. She will restart her Celebrex, I would also like her to do some home health physical therapy, she is homebound. Return to see me in 6 weeks, injection if not better.

## 2022-12-16 NOTE — Progress Notes (Signed)
    Procedures performed today:    None.  Independent interpretation of notes and tests performed by another provider:   None.  Brief History, Exam, Impression, and Recommendations:    Primary osteoarthritis of right knee Alexandra Francis returns, she is a pleasant 88 year old female, historically symptoms well-controlled with Celebrex, Tylenol, unloader brace, unfortunately she stopped the Celebrex and now pain has worsened, on exam she does have some weakness to abduction of the hips and extension of the knee. She will restart her Celebrex, I would also like her to do some home health physical therapy, she is homebound. Return to see me in 6 weeks, injection if not better.  Chronic process with exacerbation and pharmacologic intervention  ____________________________________________ Ihor Austin. Benjamin Stain, M.D., ABFM., CAQSM., AME. Primary Care and Sports Medicine [facility]  Adjunct Professor of Family Medicine  [employer]  Restaurant manager, fast food

## 2022-12-23 LAB — COMPREHENSIVE METABOLIC PANEL WITH GFR
Albumin: 4 (ref 3.5–5.0)
Calcium: 10.4 (ref 8.7–10.7)
Globulin: 3
eGFR: 81

## 2022-12-23 LAB — BASIC METABOLIC PANEL WITH GFR
BUN: 14 (ref 4–21)
CO2: 25 — AB (ref 13–22)
Chloride: 103 (ref 99–108)
Creatinine: 0.7 (ref 0.5–1.1)
Glucose: 81
Potassium: 4.4 meq/L (ref 3.5–5.1)
Sodium: 142 (ref 137–147)

## 2022-12-23 LAB — LIPID PANEL
Cholesterol: 140 (ref 0–200)
HDL: 48 (ref 35–70)
LDL Cholesterol: 73
Triglycerides: 104 (ref 40–160)

## 2022-12-23 LAB — HEPATIC FUNCTION PANEL
ALT: 18 U/L (ref 7–35)
AST: 20 (ref 13–35)
Alkaline Phosphatase: 49 (ref 25–125)
Bilirubin, Total: 0.8

## 2022-12-23 LAB — HEMOGLOBIN A1C: Hemoglobin A1C: 7.3

## 2023-01-27 ENCOUNTER — Ambulatory Visit: Payer: PPO | Admitting: Sports Medicine

## 2023-03-12 ENCOUNTER — Encounter: Payer: Self-pay | Admitting: Podiatry

## 2023-03-12 ENCOUNTER — Ambulatory Visit: Payer: PPO | Admitting: Podiatry

## 2023-03-12 DIAGNOSIS — L600 Ingrowing nail: Secondary | ICD-10-CM

## 2023-03-12 DIAGNOSIS — E1140 Type 2 diabetes mellitus with diabetic neuropathy, unspecified: Secondary | ICD-10-CM

## 2023-03-12 NOTE — Patient Instructions (Signed)

## 2023-03-12 NOTE — Progress Notes (Signed)
 Subjective:  Patient ID: Alexandra Francis, female    DOB: [birth year],   MRN: [id number]  Chief Complaint  Patient presents with   Ingrown Toenail    Pt presents for an ingrown toenail great toe on the right states she has a history of ingrown toenails pt is diabetic.    89 y.o. female presents for concern for possible ingrown nail on the right great toe that is worsening.. Relates she did well after the last trim for a while but has really been hurting and noticed some drainage coming out of the toe and was concerned. . Relates burning and tingling in their feet. Patient is diabetic and last A1c was 7.3  PCP:  Lasandra Beech, MD    . Denies any other pedal complaints. Denies n/v/f/c.   Past Medical History:  Diagnosis Date   Arthritis    Breast cancer [facility]) 1993   Right Breast Cancer   Breast cancer (HCC) 2018   Right Breast Cancer   Cancer (HCC)    rt breast, uterine cancer   Complication of anesthesia    Diabetes mellitus    Hyperlipidemia    Hypertension    Personal history of chemotherapy    Personal history of radiation therapy 1993   breast   PONV (postoperative nausea and vomiting)     Objective:  Physical Exam: Vascular: DP/PT pulses 2/4 bilateral. CFT <3 seconds. Absent hair growth on digits. Edema noted to bilateral lower extremities. Xerosis noted bilaterally.  Skin. No lacerations or abrasions bilateral feet. Nails 1-5 bilateral  are normal in appearance. No signs of infection or incurvation on the right great toe. Some tenderness to the medial border where there is some hyperkeratotic tissue.  Musculoskeletal: MMT 5/5 bilateral lower extremities in DF, PF, Inversion and Eversion. Deceased ROM in DF of ankle joint.  Neurological: Sensation intact to light touch. Protective sensation diminished bilateral.    Assessment:   1. Ingrown right greater toenail   2. Type 2 diabetes mellitus with diabetic neuropathy, without [place]-term current use of insulin (HCC)        Plan:  Patient was evaluated and treated and all questions answered. -Discussed and educated patient on diabetic foot care, especially with  regards to the vascular, neurological and musculoskeletal systems.  -Stressed the importance of good glycemic control and the detriment of not  controlling glucose levels in relation to the foot. -Discussed supportive shoes at all times and checking feet regularly.  Discussed ingrown toenails etiology and treatment options including procedure for removal vs conservative care.  Patient requesting removal of ingrown nail today. Procedure below.  Discussed procedure and post procedure care and patient expressed understanding.  Will follow-up in 2 weeks for nail check or sooner if any problems arise.    Procedure:  Procedure: partial Nail Avulsion of right hallux medial nail border.  Surgeon: Louann Sjogren, DPM  Pre-op Dx: Ingrown toenail without infection Post-op: Same  Place of Surgery: Office exam room.  Indications for surgery: Painful and ingrown toenail.    The patient is requesting removal of nail with  chemical matrixectomy. Risks and complications were discussed with the patient for which they understand and written consent was obtained. Under sterile conditions a total of 3 mL of  1% lidocaine plain was infiltrated in a hallux block fashion. Once anesthetized, the skin was prepped in sterile fashion. A tourniquet was then applied. Next the medial aspect of hallux nail border was then sharply excised making sure to remove the entire  offending nail border.  Next phenol was then applied under standard conditions to permanently destroy the matrix and copiously irrigated. Silvadene was applied. A dry sterile dressing was applied. After application of the dressing the tourniquet was removed and there is found to be an immediate capillary refill time to the digit. The patient tolerated the procedure well without any complications. Post procedure  instructions were discussed the patient for which he verbally understood. Follow-up in two weeks for nail check or sooner if any problems are to arise. Discussed signs/symptoms of infection and directed to call the office immediately should any occur or go directly to the emergency room. In the meantime, encouraged to call the office with any questions, concerns, changes symptoms.    Louann Sjogren, DPM

## 2023-03-26 ENCOUNTER — Ambulatory Visit: Payer: PPO | Admitting: Podiatry

## 2023-03-26 ENCOUNTER — Encounter: Payer: Self-pay | Admitting: Podiatry

## 2023-03-26 DIAGNOSIS — L600 Ingrowing nail: Secondary | ICD-10-CM

## 2023-03-26 NOTE — Progress Notes (Signed)
  Subjective:  Patient ID: Alexandra Francis, female    DOB: 1934/05/14,   MRN: [id number]  Chief Complaint  Patient presents with   Ingrown Toenail    Pt presents for a 2 weeks nail follow up states she is doing well.  Denies n/v/f/c.        89 y.o. female presents for follow-up of ingrown toenail procedure. Realtes doing well and soaking as instructed. . Relates burning and tingling in their feet. Patient is diabetic and last A1c was 7.3  PCP:  Lasandra Beech, MD    . Denies any other pedal complaints. Denies n/v/f/c.   Past Medical History:  Diagnosis Date   Arthritis    Breast cancer [facility]) 1993   Right Breast Cancer   Breast cancer (HCC) 2018   Right Breast Cancer   Cancer (HCC)    rt breast, uterine cancer   Complication of anesthesia    Diabetes mellitus    Hyperlipidemia    Hypertension    Personal history of chemotherapy    Personal history of radiation therapy 1993   breast   PONV (postoperative nausea and vomiting)     Objective:  Physical Exam: Vascular: DP/PT pulses 2/4 bilateral. CFT <3 seconds. Absent hair growth on digits. Edema noted to bilateral lower extremities. Xerosis noted bilaterally.  Skin. No lacerations or abrasions bilateral feet. Nails 1-5 bilateral  are normal in appearance. Right hallux nail healing well.  Musculoskeletal: MMT 5/5 bilateral lower extremities in DF, PF, Inversion and Eversion. Deceased ROM in DF of ankle joint.  Neurological: Sensation intact to light touch. Protective sensation diminished bilateral.    Assessment:   1. Ingrown right greater toenail       Plan:  Patient was evaluated and treated and all questions answered. -Discussed and educated patient on diabetic foot care, especially with  regards to the vascular, neurological and musculoskeletal systems.  -Stressed the importance of good glycemic control and the detriment of not  controlling glucose levels in relation to the foot. -Discussed supportive shoes at  all times and checking feet regularly.  Toe was evaluated and appears to be healing well.  May discontinue soaks and neosporin.  Patient to follow-up in 3 months for DM foor check .     Louann Sjogren, DPM

## 2023-04-13 ENCOUNTER — Telehealth: Payer: Self-pay | Admitting: Family Medicine

## 2023-04-13 NOTE — Telephone Encounter (Unsigned)
 Copied from CRM [id number]. Topic: Appointments - Scheduling Inquiry for Clinic >> Apr 13, 2023 12:19 PM Higinio Roger wrote: Reason for CRM: Patient would like to be seen with Dr. Marisa Sprinkles. Patient's son, Marijean Heath stated that the doctor stated she would see his mother, due to her being family. Online it states not accepting new patients  Callback #: [phone]

## 2023-04-13 NOTE — Telephone Encounter (Signed)
 Okay to schedule

## 2023-05-05 ENCOUNTER — Other Ambulatory Visit: Payer: Self-pay | Admitting: Family Medicine

## 2023-05-05 DIAGNOSIS — Z1231 Encounter for screening mammogram for malignant neoplasm of breast: Secondary | ICD-10-CM

## 2023-05-19 ENCOUNTER — Ambulatory Visit: Admitting: Family Medicine

## 2023-05-19 ENCOUNTER — Encounter: Payer: Self-pay | Admitting: Family Medicine

## 2023-05-19 VITALS — BP 128/74 | HR 71 | Ht 61.0 in | Wt 174.0 lb

## 2023-05-19 DIAGNOSIS — E1169 Type 2 diabetes mellitus with other specified complication: Secondary | ICD-10-CM | POA: Insufficient documentation

## 2023-05-19 DIAGNOSIS — Z8542 Personal history of malignant neoplasm of other parts of uterus: Secondary | ICD-10-CM | POA: Insufficient documentation

## 2023-05-19 DIAGNOSIS — M85851 Other specified disorders of bone density and structure, right thigh: Secondary | ICD-10-CM

## 2023-05-19 DIAGNOSIS — E785 Hyperlipidemia, unspecified: Secondary | ICD-10-CM

## 2023-05-19 DIAGNOSIS — M8580 Other specified disorders of bone density and structure, unspecified site: Secondary | ICD-10-CM | POA: Insufficient documentation

## 2023-05-19 DIAGNOSIS — I10 Essential (primary) hypertension: Secondary | ICD-10-CM | POA: Insufficient documentation

## 2023-05-19 DIAGNOSIS — E1140 Type 2 diabetes mellitus with diabetic neuropathy, unspecified: Secondary | ICD-10-CM | POA: Insufficient documentation

## 2023-05-19 DIAGNOSIS — E1142 Type 2 diabetes mellitus with diabetic polyneuropathy: Secondary | ICD-10-CM | POA: Diagnosis not present

## 2023-05-19 DIAGNOSIS — E11319 Type 2 diabetes mellitus with unspecified diabetic retinopathy without macular edema: Secondary | ICD-10-CM | POA: Insufficient documentation

## 2023-05-19 DIAGNOSIS — E1121 Type 2 diabetes mellitus with diabetic nephropathy: Secondary | ICD-10-CM | POA: Insufficient documentation

## 2023-05-19 DIAGNOSIS — H401131 Primary open-angle glaucoma, bilateral, mild stage: Secondary | ICD-10-CM

## 2023-05-19 NOTE — Assessment & Plan Note (Addendum)
 On amlodipine 2.5mg ,and losartan hydrochlorothiazide.  Blood pressure looks fantastic today.

## 2023-05-19 NOTE — Progress Notes (Signed)
 New Patient Office Visit  Subjective    Patient ID: Alexandra Francis, female    DOB: May 13, 1934  Age: 89 y.o. MRN: [id number]  CC:  Chief Complaint  Patient presents with   Establish Care    HPI Alexandra Francis presents to establish care She was previously followed by Albertina Alpers at [facility] .  She does have a history of diabetes and hypertension.  She also has a pretty extensive history of eye complications including cataracts, glaucoma and keratopathy.    Does have diabetes that is complicated by nephropathy and neuropathy per her medical record.  She is on an ARB and is on gabapentin  as well. Last a1 was 7.3 in December.  She follows with Dr. Horris Lynn with [facility] endocrinology.  Upcoming appointment in June with him.  Dr. Sanjuan Crumbly at [facility] for management of diabetic retinopathy.  She has a history of a corneal transplant.  In 1993 she was diagnosed with right breast cancer.  Then in 2013 diagnosed with uterine cancer and had a hysterectomy and radiation and chemo.  And then 2018 she had a new spot occur in her right breast and had to have a lumpectomy.  Hypertension-she says her blood pressures do typically stay under 140 often in the 130s over 70s.   Outpatient Encounter Medications as of 05/19/2023  Medication Sig   acetaminophen  (TYLENOL ) 650 MG CR tablet Take 1 tablet (650 mg total) by mouth every 8 (eight) hours as needed for pain.   amLODipine (NORVASC) 2.5 MG tablet Take 2.5 mg by mouth daily.   aspirin EC 81 MG tablet Take 81 mg by mouth daily.   celecoxib  (CELEBREX ) 200 MG capsule One to 2 tablets by mouth daily as needed for pain.   cholecalciferol (VITAMIN D) 1000 units tablet Take 1,000 Units by mouth daily.   Flaxseed, Linseed, (FLAXSEED OIL) 1000 MG CAPS Take 1,000 mg by mouth daily.   insulin NPH Human (HUMULIN N,NOVOLIN N) 100 UNIT/ML injection Inject 20-30 Units into the skin 2 (two) times daily. 34 units in the morning & 32 units in the evening    Lancets (ONETOUCH DELICA PLUS LANCET33G) MISC Apply 1 each topically 3 (three) times daily.   losartan-hydrochlorothiazide (HYZAAR) 100-12.5 MG per tablet Take 1 tablet by mouth daily.    LUMIGAN 0.01 % SOLN Place 1 drop into both eyes at bedtime.   Multiple Vitamin (MULTIVITAMIN) tablet Take 0.5 tablets by mouth 2 (two) times daily.   Omega-3 Fatty Acids (FISH OIL) 1000 MG CAPS Take 1,000 mg by mouth daily.   ONETOUCH VERIO test strip 1 each by Other route 2 (two) times daily.   prednisoLONE acetate (PRED FORTE) 1 % ophthalmic suspension Place 1 drop into the right eye daily.   rosuvastatin (CRESTOR) 40 MG tablet Take 40 mg by mouth daily.   timolol (TIMOPTIC) 0.5 % ophthalmic solution Place 1 drop into both eyes 2 (two) times daily.   VITAMIN E PO Take 500 Units by mouth daily.   [DISCONTINUED] brimonidine (ALPHAGAN) 0.2 % ophthalmic solution Place 1 drop into the left eye 2 (two) times daily.   [DISCONTINUED] gabapentin  (NEURONTIN ) 600 MG tablet Take 600 mg by mouth at bedtime.   [DISCONTINUED] GLIPIZIDE XL 10 MG 24 hr tablet Take 10 mg by mouth 2 (two) times daily.   [DISCONTINUED] ondansetron  (ZOFRAN -ODT) 4 MG disintegrating tablet Take 1 tablet (4 mg total) by mouth every 8 (eight) hours as needed for nausea or vomiting.   [DISCONTINUED] simvastatin (  ZOCOR) 20 MG tablet Take 20 mg by mouth at bedtime.   No facility-administered encounter medications on file as of 05/19/2023.    Past Medical History:  Diagnosis Date   Arthritis    Breast cancer [facility]) 1993   Right Breast Cancer   Breast cancer (HCC) 2018   Right Breast Cancer   Cancer (HCC)    rt breast, uterine cancer   Complication of anesthesia    Diabetes mellitus    Hyperlipidemia    Hypertension    Personal history of chemotherapy    Personal history of radiation therapy 1993   breast   PONV (postoperative nausea and vomiting)     Past Surgical History:  Procedure Laterality Date   ABDOMINAL HYSTERECTOMY     BREAST  LUMPECTOMY Right 1993   BREAST LUMPECTOMY Right 2018   BREAST LUMPECTOMY WITH RADIOACTIVE SEED LOCALIZATION Right 03/13/2016   Procedure: RIGHT BREAST LUMPECTOMY WITH RADIOACTIVE SEED LOCALIZATION;  Surgeon: Juanita Norlander, MD;  Location: [facility] OR;  Service: General;  Laterality: Right;   BREAST SURGERY  Nov 1993   right lumpectomy    Family History  Problem Relation Age of Onset   Cancer Father        unaware of origin   Cancer Brother        kidney    Social History   Socioeconomic History   Marital status: Divorced    Spouse name: Not on file   Number of children: Not on file   Years of education: Not on file   Highest education level: Not on file  Occupational History   Not on file  Tobacco Use   Smoking status: Never   Smokeless tobacco: Never  Vaping Use   Vaping status: Never Used  Substance and Sexual Activity   Alcohol use: No   Drug use: No   Sexual activity: Not on file  Other Topics Concern   Not on file  Social History Narrative   Not on file   Social Drivers of Health   Financial Resource Strain: Not on file  Food Insecurity: No Food Insecurity (06/18/2021)   Received from [facility], [facility]   Hunger Vital Sign    Worried About Running Out of Food in the Last Year: Never true    Ran Out of Food in the Last Year: Never true  Transportation Needs: Not on file  Physical Activity: Not on file  Stress: Not on file  Social Connections: Unknown (05/20/2021)   [facility], [facility]   Social Network    Social Network: Not on file  Intimate Partner Violence: Unknown (04/16/2021)   Received from [facility], [facility]   HITS    Physically Hurt: Not on file    Insult or Talk Down To: Not on file    Threaten Physical Harm: Not on file    Scream or Curse: Not on file    ROS      Objective    BP 128/74   Pulse 71   Ht 5\' 1"  (1.549 m)   Wt 174 lb (78.9 kg)   SpO2 100%   BMI 32.88 kg/m   Physical Exam Vitals and  nursing note reviewed.  Constitutional:      Appearance: Normal appearance.  HENT:     Head: Normocephalic and atraumatic.  Eyes:     Conjunctiva/sclera: Conjunctivae normal.  Cardiovascular:     Rate and Rhythm: Normal rate and regular rhythm.  Pulmonary:  Effort: Pulmonary effort is normal.     Breath sounds: Normal breath sounds.  Skin:    General: Skin is warm and dry.  Neurological:     Mental Status: She is alert.  Psychiatric:        Mood and Affect: Mood normal.         Assessment & Plan:   Problem List Items Addressed This Visit       Cardiovascular and Mediastinum   Hypertension - Primary   On amlodipine 2.5mg ,and losartan hydrochlorothiazide.  Blood pressure looks fantastic today.      Relevant Medications   amLODipine (NORVASC) 2.5 MG tablet   rosuvastatin (CRESTOR) 40 MG tablet     Endocrine   Diabetic peripheral neuropathy associated with type 2 diabetes mellitus (HCC)   Last A1c in December was 7.3.  She follows with Dr. Henrietta Lofts for endocrinology.  She is on an ARB and Crestor follows with podiatry as well.  Lab Results  Component Value Date   HGBA1C 7.3 12/23/2022         Relevant Medications   rosuvastatin (CRESTOR) 40 MG tablet   Controlled type 2 diabetes mellitus with diabetic nephropathy (HCC)   He does have neuropathy which was felt to be secondary to her chemo treatments and years ago and maybe even a combination with the diabetes.  She was on medication but it was too sedating and so right now she is managing without gabapentin  or Lyrica.  The gabapentin  she was really maxed out on was not getting a lot of relief and the Lyrica made her feel dizzy and off.      Relevant Medications   rosuvastatin (CRESTOR) 40 MG tablet     Musculoskeletal and Integument   Osteopenia   Will need to get T-score off of last bone density.  Due for updated DEXA scan we will go ahead and place that order today.      Relevant Orders   DG Bone  Density     Other   Primary open angle glaucoma of both eyes, mild stage   Follows with Dr. Clyde Darling.       Relevant Medications   LUMIGAN 0.01 % SOLN   prednisoLONE acetate (PRED FORTE) 1 % ophthalmic suspension   Hyperlipidemia   Did go ahead and refill the Crestor today.  Will need to last labs from prior PCP.      Relevant Medications   amLODipine (NORVASC) 2.5 MG tablet   rosuvastatin (CRESTOR) 40 MG tablet    Return in about 18 weeks (around 09/22/2023) for Hypertension.   Duaine German, MD

## 2023-05-21 ENCOUNTER — Encounter: Payer: Self-pay | Admitting: Family Medicine

## 2023-05-21 NOTE — Assessment & Plan Note (Addendum)
 Last A1c in December was 7.3.  She follows with Dr. Henrietta Lofts for endocrinology.  She is on an ARB and Crestor follows with podiatry as well.  Lab Results  Component Value Date   HGBA1C 7.3 12/23/2022

## 2023-05-21 NOTE — Assessment & Plan Note (Signed)
 He does have neuropathy which was felt to be secondary to her chemo treatments and years ago and maybe even a combination with the diabetes.  She was on medication but it was too sedating and so right now she is managing without gabapentin  or Lyrica.  The gabapentin  she was really maxed out on was not getting a lot of relief and the Lyrica made her feel dizzy and off.

## 2023-05-21 NOTE — Assessment & Plan Note (Addendum)
 Will need to get T-score off of last bone density.  Due for updated DEXA scan we will go ahead and place that order today.

## 2023-05-21 NOTE — Assessment & Plan Note (Signed)
 Did go ahead and refill the Crestor today.  Will need to last labs from prior PCP.

## 2023-05-21 NOTE — Assessment & Plan Note (Signed)
 Follows with Dr. Clyde Darling.

## 2023-05-27 ENCOUNTER — Ambulatory Visit
Admission: RE | Admit: 2023-05-27 | Discharge: 2023-05-27 | Disposition: A | Discharge status: Home / Self Care | Source: Ambulatory Visit | Attending: Family Medicine | Admitting: Family Medicine

## 2023-05-27 DIAGNOSIS — Z1231 Encounter for screening mammogram for malignant neoplasm of breast: Secondary | ICD-10-CM

## 2023-06-02 ENCOUNTER — Ambulatory Visit (INDEPENDENT_AMBULATORY_CARE_PROVIDER_SITE_OTHER)

## 2023-06-02 DIAGNOSIS — Z13820 Encounter for screening for osteoporosis: Secondary | ICD-10-CM | POA: Diagnosis not present

## 2023-06-02 DIAGNOSIS — M85851 Other specified disorders of bone density and structure, right thigh: Secondary | ICD-10-CM

## 2023-06-03 ENCOUNTER — Ambulatory Visit: Payer: Self-pay | Admitting: Family Medicine

## 2023-06-03 DIAGNOSIS — M8580 Other specified disorders of bone density and structure, unspecified site: Secondary | ICD-10-CM

## 2023-06-03 NOTE — Progress Notes (Signed)
 Patient: Bone density shows T-score of -1.9 just consistent with mildly thin bones called osteopenia.   The current recommendation for osteopenia (mildly thin bones) treatment includes:   #1 calcium-total of 1200 mg of calcium daily.  If you eat a very calcium rich diet you may be able to obtain that without a supplement.  If not, then I recommend calcium 500 mg twice a day.  There are several products over-the-counter such as Caltrate D and Viactiv chews which are great options that contain calcium and vitamin D. #2 vitamin D-recommend 800 international units daily. #3 exercise-recommend 30 minutes of weightbearing exercise 3 days a week.  Resistance training ,such as doing bands and light weights, can be particularly helpful.

## 2023-06-11 ENCOUNTER — Encounter: Payer: Self-pay | Admitting: Podiatry

## 2023-06-11 ENCOUNTER — Ambulatory Visit: Payer: Self-pay | Admitting: Podiatry

## 2023-06-11 DIAGNOSIS — E1140 Type 2 diabetes mellitus with diabetic neuropathy, unspecified: Secondary | ICD-10-CM | POA: Diagnosis not present

## 2023-06-11 DIAGNOSIS — B351 Tinea unguium: Secondary | ICD-10-CM

## 2023-06-11 NOTE — Progress Notes (Signed)
  Subjective:  Patient ID: Alexandra Francis, female    DOB: 1934/07/13,   MRN: [id number]  Chief Complaint  Patient presents with   Diabetes    "She told me to come back and have my toes trimmed.  I'm Diabetic." Dr. Maximiano Spanish - 6 mos. ago; A1c - 7.3    89 y.o. female presents for concern of thickened elongated and painful nails that are difficult to trim. Requesting to have them trimmed today. Relates burning and tingling in their feet. Patient is diabetic and last A1c was  Lab Results  Component Value Date   HGBA1C 7.3 12/23/2022   .   PCP:  Cydney Draft, MD     . Denies any other pedal complaints. Denies n/v/f/c.   Past Medical History:  Diagnosis Date   Arthritis    Breast cancer [facility]) 1993   Right Breast Cancer   Breast cancer (HCC) 2018   Right Breast Cancer   Cancer (HCC)    rt breast, uterine cancer   Complication of anesthesia    Diabetes mellitus    Hyperlipidemia    Hypertension    Personal history of chemotherapy    Personal history of radiation therapy 1993   breast   PONV (postoperative nausea and vomiting)     Objective:  Physical Exam: Vascular: DP/PT pulses 2/4 bilateral. CFT <3 seconds. Absent hair growth on digits. Edema noted to bilateral lower extremities. Xerosis noted bilaterally.  Skin. No lacerations or abrasions bilateral feet. Nails 1-5 bilateral  are normal in appearance. No signs of infection or incurvation on the right great toe. Musculoskeletal: MMT 5/5 bilateral lower extremities in DF, PF, Inversion and Eversion. Deceased ROM in DF of ankle joint.  Neurological: Sensation intact to light touch. Protective sensation diminished bilateral.    Assessment:   1. Type 2 diabetes mellitus with diabetic neuropathy, without Holzman-term current use of insulin (HCC)   2. Pain due to onychomycosis of toenails of both feet       Plan:  Patient was evaluated and treated and all questions answered. -Discussed and educated patient on diabetic  foot care, especially with  regards to the vascular, neurological and musculoskeletal systems.  -Stressed the importance of good glycemic control and the detriment of not  controlling glucose levels in relation to the foot. -Discussed supportive shoes at all times and checking feet regularly.  -Mechanically debrided all nails 1-5 bilateral using sterile nail nipper and filed with dremel without incident  -Answered all patient questions -Patient to return  in 3 months for at risk foot care -Patient advised to call the office if any problems or questions arise in the meantime.   Jennefer Moats, DPM

## 2023-06-23 ENCOUNTER — Encounter: Payer: Self-pay | Admitting: Family Medicine

## 2023-06-25 LAB — HM DIABETES EYE EXAM

## 2023-06-29 ENCOUNTER — Telehealth: Payer: Self-pay | Admitting: Family Medicine

## 2023-06-29 DIAGNOSIS — M1711 Unilateral primary osteoarthritis, right knee: Secondary | ICD-10-CM

## 2023-06-29 MED ORDER — CELECOXIB 200 MG PO CAPS: ORAL_CAPSULE | ORAL | 0 refills | Status: AC

## 2023-06-29 NOTE — Telephone Encounter (Signed)
 Copied from CRM [id number]. Topic: Clinical - Medication Refill >> Jun 29, 2023  1:57 PM Karole Pacer C wrote: Medication: celecoxib  (CELEBREX ) 200 MG capsule  Has the patient contacted their pharmacy? No (Agent: If no, request that the patient contact the pharmacy for the refill. If patient does not wish to contact the pharmacy document the reason why and proceed with request.) (Agent: If yes, when and what did the pharmacy advise?)  This is the patient's preferred pharmacy:  [id number] - [place], [place] - [address] [place] [address] Phone: [phone] Fax: [phone]  Is this the correct pharmacy for this prescription? Yes If no, delete pharmacy and type the correct one.   Has the prescription been filled recently? Yes  Is the patient out of the medication? Yes  Has the patient been seen for an appointment in the last year OR does the patient have an upcoming appointment? No  Can we respond through MyChart? No  Agent: Please be advised that Rx refills may take up to 3 business days. We ask that you follow-up with your pharmacy.

## 2023-07-23 ENCOUNTER — Ambulatory Visit: Payer: Self-pay | Admitting: Podiatry

## 2023-09-16 ENCOUNTER — Encounter: Payer: Self-pay | Admitting: Sports Medicine

## 2023-09-17 ENCOUNTER — Ambulatory Visit: Admitting: Podiatry

## 2023-09-17 ENCOUNTER — Encounter: Payer: Self-pay | Admitting: Podiatry

## 2023-09-17 DIAGNOSIS — E1140 Type 2 diabetes mellitus with diabetic neuropathy, unspecified: Secondary | ICD-10-CM | POA: Diagnosis not present

## 2023-09-17 DIAGNOSIS — M79675 Pain in left toe(s): Secondary | ICD-10-CM | POA: Diagnosis not present

## 2023-09-17 DIAGNOSIS — M79674 Pain in right toe(s): Secondary | ICD-10-CM | POA: Diagnosis not present

## 2023-09-17 DIAGNOSIS — B351 Tinea unguium: Secondary | ICD-10-CM

## 2023-09-17 NOTE — Progress Notes (Signed)
  Subjective:  Patient ID: Alexandra Francis, female    DOB: 04/22/1934,   MRN: [id number]  Chief Complaint  Patient presents with   Diabetes    Cut my toenails.  Saw Dr. Hosie - 06/23/2023; A1c - 6.5    89 y.o. female presents for concern of thickened elongated and painful nails that are difficult to trim. Requesting to have them trimmed today. Relates burning and tingling in their feet. Patient is diabetic and last A1c was  Lab Results  Component Value Date   HGBA1C 7.3 12/23/2022   .   PCP:  Alvan Dorothyann BIRCH, MD     . Denies any other pedal complaints. Denies n/v/f/c.   Past Medical History:  Diagnosis Date   Arthritis    Breast cancer [facility]) 1993   Right Breast Cancer   Breast cancer (HCC) 2018   Right Breast Cancer   Cancer (HCC)    rt breast, uterine cancer   Complication of anesthesia    Diabetes mellitus    Hyperlipidemia    Hypertension    Personal history of chemotherapy    Personal history of radiation therapy 1993   breast   PONV (postoperative nausea and vomiting)     Objective:  Physical Exam: Vascular: DP/PT pulses 2/4 bilateral. CFT <3 seconds. Absent hair growth on digits. Edema noted to bilateral lower extremities. Xerosis noted bilaterally.  Skin. No lacerations or abrasions bilateral feet. Nails 1-5 bilateral  are normal in appearance. No signs of infection or incurvation on the right great toe. Musculoskeletal: MMT 5/5 bilateral lower extremities in DF, PF, Inversion and Eversion. Deceased ROM in DF of ankle joint.  Neurological: Sensation intact to light touch. Protective sensation diminished bilateral.    Assessment:   1. Pain due to onychomycosis of toenails of both feet   2. Type 2 diabetes mellitus with diabetic neuropathy, without Gilham-term current use of insulin (HCC)        Plan:  Patient was evaluated and treated and all questions answered. -Discussed and educated patient on diabetic foot care, especially with  regards to the  vascular, neurological and musculoskeletal systems.  -Stressed the importance of good glycemic control and the detriment of not  controlling glucose levels in relation to the foot. -Discussed supportive shoes at all times and checking feet regularly.  -Mechanically debrided all nails 1-5 bilateral using sterile nail nipper and filed with dremel without incident  -Answered all patient questions -Patient to return  in 3 months for at risk foot care -Patient advised to call the office if any problems or questions arise in the meantime.   Alexandra Francis, DPM

## 2023-09-22 ENCOUNTER — Encounter: Payer: Self-pay | Admitting: Family Medicine

## 2023-09-22 ENCOUNTER — Ambulatory Visit (INDEPENDENT_AMBULATORY_CARE_PROVIDER_SITE_OTHER): Admitting: Family Medicine

## 2023-09-22 ENCOUNTER — Ambulatory Visit (INDEPENDENT_AMBULATORY_CARE_PROVIDER_SITE_OTHER)

## 2023-09-22 VITALS — BP 148/63 | HR 73 | Resp 20 | Ht 61.0 in | Wt 169.0 lb

## 2023-09-22 DIAGNOSIS — M25511 Pain in right shoulder: Secondary | ICD-10-CM | POA: Diagnosis not present

## 2023-09-22 DIAGNOSIS — E669 Obesity, unspecified: Secondary | ICD-10-CM | POA: Diagnosis not present

## 2023-09-22 DIAGNOSIS — M25512 Pain in left shoulder: Secondary | ICD-10-CM | POA: Diagnosis not present

## 2023-09-22 DIAGNOSIS — Z23 Encounter for immunization: Secondary | ICD-10-CM

## 2023-09-22 DIAGNOSIS — G8929 Other chronic pain: Secondary | ICD-10-CM | POA: Diagnosis not present

## 2023-09-22 DIAGNOSIS — I10 Essential (primary) hypertension: Secondary | ICD-10-CM | POA: Diagnosis not present

## 2023-09-22 DIAGNOSIS — E1169 Type 2 diabetes mellitus with other specified complication: Secondary | ICD-10-CM | POA: Diagnosis not present

## 2023-09-22 LAB — POCT GLYCOSYLATED HEMOGLOBIN (HGB A1C): Hemoglobin A1C: 6.8 % — AB (ref 4.0–5.6)

## 2023-09-22 NOTE — Assessment & Plan Note (Signed)
 Diabetes well-controlled with A1c of 6.8. Insulin regimen adjusted in June. She seeks consolidated care. - Assume management of diabetes care. - Perform annual urine protein test and foot exam. - Monitor A1c and adjust insulin as needed. - Plans to Ccancel appointments with endocrinologist.

## 2023-09-22 NOTE — Assessment & Plan Note (Signed)
 Essential hypertension Elevated blood pressure possibly due to cuff discomfort. - Recheck blood pressure manually at end of visit.

## 2023-09-22 NOTE — Progress Notes (Signed)
 Established Patient Office Visit  Subjective  Patient ID: Alexandra Francis, female    DOB: 10/04/1934  Age: 89 y.o. MRN: [id number]  Chief Complaint  Patient presents with   Hypertension    HPI Here for 3 mo f/u   Discussed the use of AI scribe software for clinical note transcription with the patient, who gave verbal consent to proceed.  History of Present Illness Alexandra Francis is an 89 year old female with neuropathy and diabetes who presents for management of neuropathy and diabetes care.  Peripheral neuropathy - Persistent neuropathic symptoms primarily affecting the feet, occasionally extending up to the thighs - Discomfort and pain sometimes disrupt sleep - Gabapentin  at maximum dosage provided no relief - Lyrica alleviated symptoms but caused intolerable side effects described as feeling 'like she was drunk' - Currently not taking medication for neuropathy - Manages symptoms by exercising her feet and walking without a cane - Occasional balance issues  Diabetes mellitus management - Insulin injections with morning dose reduced to 30 units and evening dose reduced to 26 units since June - Hemoglobin A1c improved from over 7% to 6.8%  Cutaneous lesion of left wrist - Mckinny-standing lesion on the left wrist present for several years - Recently became painful, especially with pressure - Applies cream to the area  Bilat Shoulder pain and functional limitation - Shoulder pain, particularly in the right shoulder - Difficulty lifting the right arm - 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     ROS    Objective:     BP (!) 148/63 (BP Location: Left Wrist, Cuff Size: Small)   Pulse 73   Resp 20   Ht 5' 1 (1.549 m)   Wt 169 lb (76.7 kg)   SpO2 100%   BMI 31.93 kg/m    Physical Exam Vitals and nursing note reviewed.   Constitutional:      Appearance: Normal appearance.  HENT:     Head: Normocephalic and atraumatic.  Eyes:     Conjunctiva/sclera: Conjunctivae normal.  Cardiovascular:     Rate and Rhythm: Normal rate and regular rhythm.  Pulmonary:     Effort: Pulmonary effort is normal.     Breath sounds: Normal breath sounds.  Skin:    General: Skin is warm and dry.  Neurological:     Mental Status: She is alert.  Psychiatric:        Mood and Affect: Mood normal.      Results for orders placed or performed in visit on 09/22/23  POCT glycosylated hemoglobin (Hb A1C)  Result Value Ref Range   Hemoglobin A1C 6.8 (A) 4.0 - 5.6 %   HbA1c POC (<> result, manual entry)     HbA1c, POC (prediabetic range)     HbA1c, POC (controlled diabetic range)        The ASCVD Risk score (Arnett DK, et al., 2019) failed to calculate for the following reasons:   The 2019 ASCVD risk score is only valid for ages 12 to 26    Assessment & Plan:   Problem List Items Addressed This Visit       Cardiovascular and Mediastinum   Hypertension - Primary   Essential hypertension Elevated blood pressure possibly due to cuff discomfort. - Recheck blood pressure manually at end of visit.      Relevant Orders   CMP14+EGFR  Urine Microalbumin w/creat. ratio   Lipid panel     Endocrine   Type 2 diabetes mellitus with obesity (HCC)   Diabetes well-controlled with A1c of 6.8. Insulin regimen adjusted in June. She seeks consolidated care. - Assume management of diabetes care. - Perform annual urine protein test and foot exam. - Monitor A1c and adjust insulin as needed. - Plans to Ccancel appointments with endocrinologist.      Relevant Orders   CMP14+EGFR   Urine Microalbumin w/creat. ratio   Lipid panel   POCT glycosylated hemoglobin (Hb A1C) (Completed)   Other Visit Diagnoses       Chronic pain of both shoulders       Relevant Orders   DG Shoulder Left   DG Shoulder Right     Immunization due        Relevant Orders   Flu vaccine HIGH DOSE PF(Fluzone Trivalent) (Completed)      Assessment and Plan Assessment & Plan Peripheral neuropathy Chronic neuropathy with ineffective or poorly tolerated gabapentin  and Lyrica. Prefers non-systemic treatments. Mentholated topicals and capsaicin cream considered. Cymbalta as alternative if topicals fail. - Recommend trial of mentholated topical products for relief. - Consider capsaicin cream as alternative. - Discuss Cymbalta if topicals ineffective.   Right shoulder pain, possible rotator cuff injury or bursitis Chronic shoulder pain with limited motion, possibly bursitis or rotator cuff injury. Pain worsens with lifting. - Order x-rays of right shoulder. - Consider referral to sports medicine or orthopedic specialist based on x-ray findings.  Left wrist skin lesion, planned excision and biopsy Chronic left wrist lesion with recent pain. Plan excision and biopsy to rule out malignancy. - Schedule excision and biopsy of left wrist lesion.  General Health Maintenance Discussed flu and pneumonia vaccinations. Uncertain about Prevnar 20 status. - Administer flu shot. - Check records for Prevnar 20 and administer if not given.   Return in about 4 months (around 01/22/2024) for Diabetes follow-up.    Dorothyann Byars, MD

## 2023-09-23 LAB — CMP14+EGFR
ALT: 17 IU/L (ref 0–32)
AST: 22 IU/L (ref 0–40)
Albumin: 4.3 g/dL (ref 3.7–4.7)
Alkaline Phosphatase: 49 IU/L (ref 44–121)
BUN/Creatinine Ratio: 23 (ref 12–28)
BUN: 14 mg/dL (ref 8–27)
Bilirubin Total: 0.8 mg/dL (ref 0.0–1.2)
CO2: 21 mmol/L (ref 20–29)
Calcium: 10.4 mg/dL — ABNORMAL HIGH (ref 8.7–10.3)
Chloride: 98 mmol/L (ref 96–106)
Creatinine, Ser: 0.6 mg/dL (ref 0.57–1.00)
Globulin, Total: 3.1 g/dL (ref 1.5–4.5)
Glucose: 113 mg/dL — ABNORMAL HIGH (ref 70–99)
Potassium: 4.3 mmol/L (ref 3.5–5.2)
Sodium: 136 mmol/L (ref 134–144)
Total Protein: 7.4 g/dL (ref 6.0–8.5)
eGFR: 87 mL/min/1.73 (ref >59)

## 2023-09-23 LAB — LIPID PANEL
Chol/HDL Ratio: 2.8 ratio (ref 0.0–4.4)
Cholesterol, Total: 142 mg/dL (ref 100–199)
HDL: 50 mg/dL (ref >39)
LDL Chol Calc (NIH): 67 mg/dL (ref 0–99)
Triglycerides: 143 mg/dL (ref 0–149)
VLDL Cholesterol Cal: 25 mg/dL (ref 5–40)

## 2023-09-23 LAB — MICROALBUMIN / CREATININE URINE RATIO
Creatinine, Urine: 19.4 mg/dL
Microalb/Creat Ratio: 119 mg/g{creat} — ABNORMAL HIGH (ref 0–29)
Microalbumin, Urine: 23.1 ug/mL

## 2023-09-24 ENCOUNTER — Ambulatory Visit: Payer: Self-pay | Admitting: Family Medicine

## 2023-09-24 NOTE — Progress Notes (Signed)
 Call patient: She does have some extra protein in the urine.  Continue taking the losartan.  This actually does help protect her renal function we will keep an eye on the protein levels. Cholesterol looks good.  Metabolic panel is okay.  Calcium level is a little borderline elevated but if she is taking some extra calcium that most likely would explain it.

## 2023-09-27 ENCOUNTER — Ambulatory Visit (INDEPENDENT_AMBULATORY_CARE_PROVIDER_SITE_OTHER): Admitting: Family Medicine

## 2023-09-27 ENCOUNTER — Encounter: Payer: Self-pay | Admitting: Family Medicine

## 2023-09-27 ENCOUNTER — Other Ambulatory Visit: Payer: Self-pay | Admitting: Family Medicine

## 2023-09-27 VITALS — BP 128/66 | HR 69 | Ht 61.0 in | Wt 170.0 lb

## 2023-09-27 DIAGNOSIS — G8929 Other chronic pain: Secondary | ICD-10-CM | POA: Diagnosis not present

## 2023-09-27 DIAGNOSIS — M19012 Primary osteoarthritis, left shoulder: Secondary | ICD-10-CM

## 2023-09-27 DIAGNOSIS — M25511 Pain in right shoulder: Secondary | ICD-10-CM | POA: Diagnosis not present

## 2023-09-27 DIAGNOSIS — L989 Disorder of the skin and subcutaneous tissue, unspecified: Secondary | ICD-10-CM

## 2023-09-27 NOTE — Progress Notes (Signed)
 Pt reports that she has had a small bump/cyst on her L wrist for years that at times will become painful. She stated that she had spoken with her previous provider about this and was told that it wasn't concerning. However, she is concerned about this due to being a Cancer survivor.

## 2023-09-27 NOTE — Assessment & Plan Note (Signed)
 Let her know that the x-rays show moderate to severe arthritis of the glenohumeral joint and the [facility] joint.  Still awaiting the x-rays on the right shoulder I think she would best be served by referring her to orthopedics at this point to discuss options.

## 2023-09-27 NOTE — Progress Notes (Signed)
   Acute Office Visit  Subjective:     Patient ID: Alexandra Francis, female    DOB: [birth year], 89 y.o.   MRN: [id number]  No chief complaint on file.   HPI Patient is in today for  Pt reports that she has had a small bump/cyst on her L wrist for years that at times will become painful. She stated that she had spoken with her previous provider about this and was told that it wasn't concerning. However, she is concerned about this due to being a Cancer survivor. She would like it removed.      Lesion on dorsum of left forearm near wrist.    She also wanted to know if the x-rays of her shoulders were back she has bilateral shoulder pain and has had surgery on the right 1 before the right one is the one that bothers her the most.  ROS      Objective:    BP 128/66   Pulse 69   Ht 5' 1 (1.549 m)   Wt 170 lb 0.6 oz (77.1 kg)   SpO2 99%   BMI 32.13 kg/m     Physical Exam  Shiny papule on the dorsum on right wrist.     No results found for any visits on 09/27/23.      Assessment & Plan:   Problem List Items Addressed This Visit       Musculoskeletal and Integument   Primary osteoarthritis of left shoulder   Let her know that the x-rays show moderate to severe arthritis of the glenohumeral joint and the [facility] joint.  Still awaiting the x-rays on the right shoulder I think she would best be served by referring her to orthopedics at this point to discuss options.      Relevant Orders   Ambulatory referral to Orthopedic Surgery   Other Visit Diagnoses       Skin lesion of left arm    -  Primary   Relevant Orders   Surgical pathology     Chronic right shoulder pain       Relevant Orders   Ambulatory referral to Orthopedic Surgery        Shave Biopsy Procedure Note  Pre-operative Diagnosis: Suspicious lesion  Post-operative Diagnosis: same  Locations:left, wrist posterior   Indications: worried about skin lesion with Ca history   Anesthesia: Lidocaine  1%  without epinephrine  without added sodium bicarbonate  Procedure Details  Patient informed of the risks (including bleeding and infection) and benefits of the  procedure and Verbal informed consent obtained.  The lesion and surrounding area were given a sterile prep using chlorhexidine  and draped in the usual sterile fashion. A scalpel was used to shave an area of skin approximately 7cm by 7cm.  Hemostasis achieved with alumuninum chloride. Antibiotic ointment and a sterile dressing applied.  The specimen was sent for pathologic examination. The patient tolerated the procedure well.  EBL: trace  Findings: Await pathology   Condition: Stable  Complications: none.  Plan: 1. Instructed to keep the wound dry and covered for 24-48h and clean thereafter. 2. Warning signs of infection were reviewed.   3. Recommended that the patient use OTC acetaminophen  as needed for pain.  4. Return PRN.  No orders of the defined types were placed in this encounter.   Return if symptoms worsen or fail to improve.  Dorothyann Byars, MD

## 2023-09-28 NOTE — Progress Notes (Signed)
 Reviewed results of left shoulder x-ray during office visit.

## 2023-09-29 LAB — DERMATOLOGY PATHOLOGY

## 2023-09-30 NOTE — Progress Notes (Signed)
 Hi Alexandra Francis, we did get the read back on your right shoulder.  It shows some pretty advanced arthritis in the glenohumeral joint.  That is the ball-and-socket part of the shoulder.  And you have some moderate arthritis in the [facility] joint which is where the clavicle meets the shoulder.  It also looks like you probably have some issues with your rotator cuff on the right as well.  I know we have placed a referral to the orthopedist.

## 2023-10-01 ENCOUNTER — Ambulatory Visit: Payer: Self-pay | Admitting: Family Medicine

## 2023-10-01 NOTE — Progress Notes (Signed)
 Call patient: The skin biopsy on her wrist came back positive for what is called a schwannoma.  This is considered benign.  Pretty sure we got all of the tissue the base looked clean when we took it off so it should just heal perfectly fine with no worries.  If it starts to grow back then please just let me know and at that point we would get you in with a dermatologist.  But they said they did not see any atypical cells which is good.

## 2023-10-01 NOTE — Telephone Encounter (Signed)
 Pt son rodrick returned my call is aware of results and voiced his understanding. Alexandra Francis  [place]

## 2023-10-06 NOTE — Telephone Encounter (Signed)
 Patient called returning call, transferred to [facility], thanks.

## 2023-10-06 NOTE — Telephone Encounter (Signed)
 Copied from CRM #[id number]. Topic: Clinical - Lab/Test Results >> Oct 06, 2023 11:47 AM Miquel SAILOR wrote: Reason for CRM: Patient son Cozette returning office call for Biopsy results. Called office and transferred

## 2023-10-06 NOTE — Telephone Encounter (Signed)
 Copied from CRM [id number]. Topic: Clinical - Lab/Test Results >> Oct 01, 2023  8:08 AM Merlynn A wrote: Reason for CRM: Patients son called in returning call regarding lab results. Warm transferred to CAL for further assistance.

## 2023-10-06 NOTE — Telephone Encounter (Signed)
 Spoke with the pts son jerrod. He is aware of results and voiced his understanding. Annabella Rigg, CMA

## 2023-10-06 NOTE — Telephone Encounter (Signed)
 This was resulted. Does he need anything else

## 2023-10-13 ENCOUNTER — Ambulatory Visit: Payer: Self-pay

## 2023-10-13 NOTE — Telephone Encounter (Signed)
 FYI Only or Action Required?: FYI only for provider.  Patient was last seen in primary care on 09/27/2023 by Alvan Dorothyann BIRCH, MD.  Called Nurse Triage reporting Medication Problem.   Triage Disposition: Call PCP When Office is Open  Patient/caregiver understands and will follow disposition?: Yes     Copied from CRM #[id number]. Topic: Clinical - Medication Question >> Oct 13, 2023 10:48 AM Larissa RAMAN wrote: Reason for CRM: Myer Fruits, pt  with [facility] calling to report a level 1  drug interaction Reason for Disposition  [1] Caller has NON-URGENT medicine question about med that PCP prescribed AND [2] triager unable to answer question  Answer Assessment - Initial Assessment Questions This RN spoke with Myer Fruits with [facility]. He states he is calling to report a level 1 drug interaction with amlodipine and rosuvastatin for patient. Will route to office for follow up.     1. NAME of MEDICINE: What medicine(s) are you calling about?     Amlodipine and Rosuvastatin  2. QUESTION: What is your question? (e.g., double dose of medicine, side effect)     Calling to report a drug interaction  3. PRESCRIBER: Who prescribed the medicine? Reason: if prescribed by specialist, call should be referred to that group.     PCP 4. SYMPTOMS: Do you have any symptoms? If Yes, ask: What symptoms are you having?  How bad are the symptoms (e.g., mild, moderate, severe)     Denies that the patient is having any symptoms  Protocols used: Medication Question Call-A-AH

## 2023-10-15 NOTE — Telephone Encounter (Signed)
 No change needed at this time in meds

## 2023-10-20 ENCOUNTER — Other Ambulatory Visit: Payer: Self-pay | Admitting: Family Medicine

## 2023-10-20 DIAGNOSIS — E785 Hyperlipidemia, unspecified: Secondary | ICD-10-CM

## 2023-10-20 MED ORDER — AMLODIPINE BESYLATE 2.5 MG PO TABS: 2.5000 mg | ORAL_TABLET | Freq: Every day | ORAL | 1 refills | Status: AC

## 2023-10-20 MED ORDER — ROSUVASTATIN CALCIUM 40 MG PO TABS: 40.0000 mg | ORAL_TABLET | Freq: Every day | ORAL | 3 refills | Status: AC

## 2023-10-20 MED ORDER — LOSARTAN POTASSIUM-HCTZ 100-12.5 MG PO TABS: 1.0000 | ORAL_TABLET | Freq: Every day | ORAL | 1 refills | Status: AC

## 2023-10-25 ENCOUNTER — Other Ambulatory Visit: Payer: Self-pay | Admitting: Family Medicine

## 2023-10-25 DIAGNOSIS — E785 Hyperlipidemia, unspecified: Secondary | ICD-10-CM

## 2023-10-25 NOTE — Telephone Encounter (Signed)
 Rxs written by external provider.

## 2023-10-25 NOTE — Telephone Encounter (Signed)
 Copied from CRM [id number]. Topic: Clinical - Medication Question >> Oct 25, 2023  3:31 PM Antwanette L wrote: Reason for CRM: Marcene, a pharmacy technician with [facility], is calling to request refills for the following medications and supplies: - Lancets - OneTouch Delica Plus Lancet 33G (MISC) - Vitamin D3 - Sodium Chloride  5% Eye Ointment - Novolin 100 unit/mL injection - Omega-3 Fatty Acids (Fish Oil) 1000 mg capsules - Multivitamin tablets - Prednisolone Acetate (Pred Forte) 1% Ophthalmic Suspension - Rosuvastatin (Crestor) 40 mg tablets - Vitamin E (oral)

## 2023-10-26 MED ORDER — PREDNISOLONE ACETATE 1 % OP SUSP: 1.0000 [drp] | Freq: Every day | OPHTHALMIC | 0 refills | Status: DC

## 2023-10-26 MED ORDER — FISH OIL 1000 MG PO CAPS: 1000.0000 mg | ORAL_CAPSULE | Freq: Every day | ORAL | 0 refills | Status: AC

## 2023-10-26 MED ORDER — ONE-DAILY MULTI VITAMINS PO TABS: 0.5000 | ORAL_TABLET | Freq: Two times a day (BID) | ORAL | 0 refills | Status: AC

## 2023-11-04 ENCOUNTER — Other Ambulatory Visit: Payer: Self-pay | Admitting: Medical-Surgical

## 2023-11-08 ENCOUNTER — Other Ambulatory Visit: Payer: Self-pay | Admitting: Family Medicine

## 2023-11-08 NOTE — Telephone Encounter (Unsigned)
 Copied from CRM [id number]. Topic: Clinical - Medication Refill >> Nov 08, 2023  2:27 PM Yolanda T wrote: Medication: prednisoLONE acetate (PRED FORTE) 1 % ophthalmic suspension  Has the patient contacted their pharmacy? Yes  This is the patient's preferred pharmacy:  [facility] [place]  [place], [place] - [address] [place] [address] Phone: [phone] Fax: [phone]  Is this the correct pharmacy for this prescription? Yes  Has the prescription been filled recently? Yes  Is the patient out of the medication? No  Has the patient been seen for an appointment in the last year OR does the patient have an upcoming appointment? Yes  Can we respond through MyChart? No  Agent: Please be advised that Rx refills may take up to 3 business days. We ask that you follow-up with your pharmacy.

## 2023-12-17 ENCOUNTER — Ambulatory Visit: Admitting: Podiatry

## 2023-12-17 DIAGNOSIS — Z91199 Patient's noncompliance with other medical treatment and regimen due to unspecified reason: Secondary | ICD-10-CM

## 2023-12-17 NOTE — Progress Notes (Signed)
Cancel weather

## 2023-12-28 ENCOUNTER — Ambulatory Visit: Admitting: Podiatry

## 2023-12-29 ENCOUNTER — Ambulatory Visit: Admitting: Podiatry

## 2024-01-21 ENCOUNTER — Ambulatory Visit: Admitting: Podiatry

## 2024-01-21 ENCOUNTER — Encounter: Payer: Self-pay | Admitting: Podiatry

## 2024-01-21 DIAGNOSIS — B351 Tinea unguium: Secondary | ICD-10-CM | POA: Diagnosis not present

## 2024-01-21 DIAGNOSIS — E1140 Type 2 diabetes mellitus with diabetic neuropathy, unspecified: Secondary | ICD-10-CM | POA: Diagnosis not present

## 2024-01-21 DIAGNOSIS — M79674 Pain in right toe(s): Secondary | ICD-10-CM

## 2024-01-21 DIAGNOSIS — M79675 Pain in left toe(s): Secondary | ICD-10-CM

## 2024-01-21 NOTE — Progress Notes (Signed)
"  °  Subjective:  Patient ID: Alexandra Francis, female    DOB: Aug 11, 1934,   MRN: [id number]  Chief Complaint  Patient presents with   Diabetes    Supposed to trim my toenails today. Saw Dr. Alvan 09/27/2023; A1c - 6.8    89 y.o. female presents for concern of thickened elongated and painful nails that are difficult to trim. Requesting to have them trimmed today. Relates burning and tingling in their feet. Patient is diabetic and last A1c was  Lab Results  Component Value Date   HGBA1C 6.8 (A) 09/22/2023   .   PCP:  Alvan Dorothyann BIRCH, MD     . Denies any other pedal complaints. Denies n/v/f/c.   Past Medical History:  Diagnosis Date   Arthritis    Breast cancer [facility]) 1993   Right Breast Cancer   Breast cancer (HCC) 2018   Right Breast Cancer   Cancer (HCC)    rt breast, uterine cancer   Complication of anesthesia    Diabetes mellitus    Hyperlipidemia    Hypertension    Personal history of chemotherapy    Personal history of radiation therapy 1993   breast   PONV (postoperative nausea and vomiting)     Objective:  Physical Exam: Vascular: DP/PT pulses 2/4 bilateral. CFT <3 seconds. Absent hair growth on digits. Edema noted to bilateral lower extremities. Xerosis noted bilaterally.  Skin. No lacerations or abrasions bilateral feet. Nails 1-5 bilateral  are normal in appearance. No signs of infection or incurvation on the right great toe. Musculoskeletal: MMT 5/5 bilateral lower extremities in DF, PF, Inversion and Eversion. Deceased ROM in DF of ankle joint.  Neurological: Sensation intact to light touch. Protective sensation diminished bilateral.    Assessment:   1. Pain due to onychomycosis of toenails of both feet   2. Type 2 diabetes mellitus with diabetic neuropathy, without Toothaker-term current use of insulin (HCC)        Plan:  Patient was evaluated and treated and all questions answered. -Discussed and educated patient on diabetic foot care, especially  with  regards to the vascular, neurological and musculoskeletal systems.  -Stressed the importance of good glycemic control and the detriment of not  controlling glucose levels in relation to the foot. -Discussed supportive shoes at all times and checking feet regularly.  -Mechanically debrided all nails 1-5 bilateral using sterile nail nipper and filed with dremel without incident  -Answered all patient questions -Patient to return  in 6 months for at risk foot care -Patient advised to call the office if any problems or questions arise in the meantime.   Asberry Failing, DPM    "

## 2024-01-24 ENCOUNTER — Ambulatory Visit: Admitting: Family Medicine

## 2024-01-25 LAB — OPHTHALMOLOGY REPORT-SCANNED

## 2024-02-14 ENCOUNTER — Ambulatory Visit: Admitting: Family Medicine

## 2024-02-29 ENCOUNTER — Ambulatory Visit: Admitting: Family Medicine

## 2024-06-16 ENCOUNTER — Ambulatory Visit: Admitting: Podiatry
